# Patient Record
Sex: Male | Born: 1980 | Race: White | Hispanic: No | Marital: Single | Smoking: Never smoker
Health system: Southern US, Community
[De-identification: ages and names within clinical notes are randomized; demographics above are authoritative.]

## PROBLEM LIST (undated history)

## (undated) ENCOUNTER — Inpatient Hospital Stay: Payer: Self-pay | Admitting: Physician Assistant

## (undated) DIAGNOSIS — R079 Chest pain, unspecified: Secondary | ICD-10-CM

## (undated) DIAGNOSIS — K5790 Diverticulosis of intestine, part unspecified, without perforation or abscess without bleeding: Secondary | ICD-10-CM

## (undated) DIAGNOSIS — R4586 Emotional lability: Secondary | ICD-10-CM

## (undated) DIAGNOSIS — R002 Palpitations: Secondary | ICD-10-CM

## (undated) DIAGNOSIS — F32A Depression, unspecified: Secondary | ICD-10-CM

## (undated) DIAGNOSIS — N200 Calculus of kidney: Secondary | ICD-10-CM

## (undated) DIAGNOSIS — G43909 Migraine, unspecified, not intractable, without status migrainosus: Secondary | ICD-10-CM

## (undated) DIAGNOSIS — R569 Unspecified convulsions: Secondary | ICD-10-CM

## (undated) DIAGNOSIS — F329 Major depressive disorder, single episode, unspecified: Secondary | ICD-10-CM

## (undated) DIAGNOSIS — R9430 Abnormal result of cardiovascular function study, unspecified: Secondary | ICD-10-CM

## (undated) DIAGNOSIS — IMO0002 Reserved for concepts with insufficient information to code with codable children: Secondary | ICD-10-CM

## (undated) DIAGNOSIS — I10 Essential (primary) hypertension: Secondary | ICD-10-CM

## (undated) DIAGNOSIS — G4730 Sleep apnea, unspecified: Secondary | ICD-10-CM

## (undated) DIAGNOSIS — G9381 Temporal sclerosis: Secondary | ICD-10-CM

## (undated) DIAGNOSIS — K219 Gastro-esophageal reflux disease without esophagitis: Secondary | ICD-10-CM

## (undated) HISTORY — DX: Palpitations: R00.2

## (undated) HISTORY — PX: HERNIA REPAIR: SHX51

## (undated) HISTORY — PX: NECK SURGERY: SHX720

## (undated) HISTORY — DX: Abnormal result of cardiovascular function study, unspecified: R94.30

## (undated) HISTORY — DX: Reserved for concepts with insufficient information to code with codable children: IMO0002

## (undated) HISTORY — DX: Temporal sclerosis: G93.81

## (undated) HISTORY — PX: WRIST FUSION: SHX839

## (undated) HISTORY — PX: CARDIAC CATHETERIZATION: SHX172

## (undated) HISTORY — PX: TONSILLECTOMY: SUR1361

## (undated) HISTORY — DX: Gastro-esophageal reflux disease without esophagitis: K21.9

## (undated) HISTORY — PX: CHOLECYSTECTOMY: SHX55

---

## 2002-03-24 ENCOUNTER — Emergency Department (HOSPITAL_COMMUNITY): Admission: EM | Admit: 2002-03-24 | Discharge: 2002-03-25 | Payer: Self-pay | Admitting: Emergency Medicine

## 2002-03-24 ENCOUNTER — Encounter: Payer: Self-pay | Admitting: Emergency Medicine

## 2002-12-24 ENCOUNTER — Inpatient Hospital Stay (HOSPITAL_COMMUNITY): Admission: EM | Admit: 2002-12-24 | Discharge: 2002-12-25 | Payer: Self-pay | Admitting: Emergency Medicine

## 2002-12-24 ENCOUNTER — Encounter: Payer: Self-pay | Admitting: Emergency Medicine

## 2002-12-25 ENCOUNTER — Encounter: Payer: Self-pay | Admitting: *Deleted

## 2003-08-24 ENCOUNTER — Emergency Department (HOSPITAL_COMMUNITY): Admission: EM | Admit: 2003-08-24 | Discharge: 2003-08-25 | Payer: Self-pay | Admitting: Emergency Medicine

## 2004-05-26 ENCOUNTER — Emergency Department (HOSPITAL_COMMUNITY): Admission: EM | Admit: 2004-05-26 | Discharge: 2004-05-26 | Payer: Self-pay | Admitting: Family Medicine

## 2004-06-29 ENCOUNTER — Emergency Department (HOSPITAL_COMMUNITY): Admission: EM | Admit: 2004-06-29 | Discharge: 2004-06-30 | Payer: Self-pay | Admitting: Emergency Medicine

## 2006-04-01 ENCOUNTER — Emergency Department (HOSPITAL_COMMUNITY): Admission: EM | Admit: 2006-04-01 | Discharge: 2006-04-01 | Payer: Self-pay | Admitting: Emergency Medicine

## 2011-04-03 ENCOUNTER — Inpatient Hospital Stay (INDEPENDENT_AMBULATORY_CARE_PROVIDER_SITE_OTHER)
Admission: RE | Admit: 2011-04-03 | Discharge: 2011-04-03 | Disposition: A | Discharge status: Home / Self Care | Payer: Medicaid Other | Source: Ambulatory Visit | Attending: Emergency Medicine | Admitting: Emergency Medicine

## 2011-04-03 DIAGNOSIS — I10 Essential (primary) hypertension: Secondary | ICD-10-CM

## 2011-04-03 DIAGNOSIS — R569 Unspecified convulsions: Secondary | ICD-10-CM

## 2011-05-14 ENCOUNTER — Emergency Department (HOSPITAL_COMMUNITY)
Admission: EM | Admit: 2011-05-14 | Discharge: 2011-05-14 | Disposition: A | Discharge status: Home / Self Care | Payer: Medicaid Other | Attending: Emergency Medicine | Admitting: Emergency Medicine

## 2011-05-14 ENCOUNTER — Encounter: Payer: Self-pay | Admitting: Emergency Medicine

## 2011-05-14 ENCOUNTER — Emergency Department (HOSPITAL_COMMUNITY): Payer: Medicaid Other

## 2011-05-14 DIAGNOSIS — Z87891 Personal history of nicotine dependence: Secondary | ICD-10-CM | POA: Insufficient documentation

## 2011-05-14 DIAGNOSIS — F3289 Other specified depressive episodes: Secondary | ICD-10-CM | POA: Insufficient documentation

## 2011-05-14 DIAGNOSIS — R569 Unspecified convulsions: Secondary | ICD-10-CM | POA: Insufficient documentation

## 2011-05-14 DIAGNOSIS — I10 Essential (primary) hypertension: Secondary | ICD-10-CM | POA: Insufficient documentation

## 2011-05-14 DIAGNOSIS — R109 Unspecified abdominal pain: Secondary | ICD-10-CM | POA: Insufficient documentation

## 2011-05-14 DIAGNOSIS — K219 Gastro-esophageal reflux disease without esophagitis: Secondary | ICD-10-CM | POA: Insufficient documentation

## 2011-05-14 DIAGNOSIS — F329 Major depressive disorder, single episode, unspecified: Secondary | ICD-10-CM | POA: Insufficient documentation

## 2011-05-14 DIAGNOSIS — J45909 Unspecified asthma, uncomplicated: Secondary | ICD-10-CM | POA: Insufficient documentation

## 2011-05-14 DIAGNOSIS — K5730 Diverticulosis of large intestine without perforation or abscess without bleeding: Secondary | ICD-10-CM | POA: Insufficient documentation

## 2011-05-14 DIAGNOSIS — G43909 Migraine, unspecified, not intractable, without status migrainosus: Secondary | ICD-10-CM | POA: Insufficient documentation

## 2011-05-14 HISTORY — DX: Essential (primary) hypertension: I10

## 2011-05-14 HISTORY — DX: Unspecified convulsions: R56.9

## 2011-05-14 HISTORY — DX: Depression, unspecified: F32.A

## 2011-05-14 HISTORY — DX: Major depressive disorder, single episode, unspecified: F32.9

## 2011-05-14 HISTORY — DX: Migraine, unspecified, not intractable, without status migrainosus: G43.909

## 2011-05-14 HISTORY — DX: Diverticulosis of intestine, part unspecified, without perforation or abscess without bleeding: K57.90

## 2011-05-14 HISTORY — DX: Emotional lability: R45.86

## 2011-05-14 LAB — URINALYSIS, ROUTINE W REFLEX MICROSCOPIC
Bilirubin Urine: NEGATIVE
Glucose, UA: NEGATIVE mg/dL
Hgb urine dipstick: NEGATIVE
Nitrite: NEGATIVE
Protein, ur: NEGATIVE mg/dL
Specific Gravity, Urine: 1.02 (ref 1.005–1.030)
pH: 6 (ref 5.0–8.0)

## 2011-05-14 LAB — CBC
HCT: 39.6 % (ref 39.0–52.0)
Hemoglobin: 13.9 g/dL (ref 13.0–17.0)
MCH: 29.8 pg (ref 26.0–34.0)
MCHC: 35.1 g/dL (ref 30.0–36.0)
MCV: 85 fL (ref 78.0–100.0)
Platelets: 205 10*3/uL (ref 150–400)
RBC: 4.66 MIL/uL (ref 4.22–5.81)
RDW: 12.9 % (ref 11.5–15.5)

## 2011-05-14 LAB — BASIC METABOLIC PANEL
BUN: 13 mg/dL (ref 6–23)
CO2: 27 mEq/L (ref 19–32)
Calcium: 10.3 mg/dL (ref 8.4–10.5)
Chloride: 100 mEq/L (ref 96–112)
GFR calc Af Amer: >90 mL/min (ref >90)
Glucose, Bld: 81 mg/dL (ref 70–99)
Sodium: 137 mEq/L (ref 135–145)

## 2011-05-14 MED ORDER — HYDROMORPHONE HCL PF 1 MG/ML IJ SOLN
0.5000 mg | Freq: Once | INTRAMUSCULAR | Status: AC
  Administered 2011-05-14: 0.5 mg via INTRAVENOUS
  Filled 2011-05-14: qty 1

## 2011-05-14 MED ORDER — HYDROMORPHONE HCL PF 1 MG/ML IJ SOLN
1.0000 mg | Freq: Once | INTRAMUSCULAR | Status: AC
  Administered 2011-05-14: 1 mg via INTRAVENOUS
  Filled 2011-05-14: qty 1

## 2011-05-14 MED ORDER — HYDROCODONE-ACETAMINOPHEN 5-500 MG PO TABS: 1.0000 | ORAL_TABLET | Freq: Four times a day (QID) | ORAL | Status: AC | PRN

## 2011-05-14 MED ORDER — ONDANSETRON HCL 4 MG/2ML IJ SOLN
4.0000 mg | Freq: Once | INTRAMUSCULAR | Status: AC
  Administered 2011-05-14: 4 mg via INTRAVENOUS
  Filled 2011-05-14: qty 2

## 2011-05-14 MED ORDER — SODIUM CHLORIDE 0.9 % IV SOLN
Freq: Once | INTRAVENOUS | Status: AC
  Administered 2011-05-14: 03:00:00 via INTRAVENOUS

## 2011-05-14 MED ORDER — IOHEXOL 300 MG/ML  SOLN
100.0000 mL | Freq: Once | INTRAMUSCULAR | Status: AC | PRN
  Administered 2011-05-14: 100 mL via INTRAVENOUS

## 2011-05-14 NOTE — ED Notes (Signed)
Pt requesting pain medication, orders received.

## 2011-05-14 NOTE — ED Notes (Addendum)
Patient complaining of lower abdominal pain x 2-3 days. Also complaining of nausea, denies vomiting. Pt has history of diverticulitis.

## 2011-05-14 NOTE — ED Notes (Signed)
Pt talking on cell phone upon RN entering the room.

## 2011-05-14 NOTE — ED Provider Notes (Signed)
History     CSN: [id number] Arrival date & time: 05/14/2011  1:56 AM   First MD Initiated Contact with Patient 05/14/11 0206      Chief Complaint  Patient presents with  . Abdominal Pain    (Consider location/radiation/quality/duration/timing/severity/associated sxs/prior treatment) HPI Comments: 30 year old male with a history of cholecystectomy, bilateral inguinal hernia repairs as well as umbilical hernia repair who presents with approximately 3 days of abdominal pain. The location is the lower abdomen, the feeling is a tearing or pulling sensation. It is made worse with palpation and this evening while he was lifting heavy pallets of soda pop the pain became severe. He admits that he has no diarrhea or change in his urinary habits and no blood in his stools. He is actively passing gas and has had no change in bowel habits. He denies nausea or vomiting prior to his arrival.  Symptoms are constant and currently rated 8/10 in severity  Patient is a 30 y.o. male presenting with abdominal pain. The history is provided by the patient.  Abdominal Pain The primary symptoms of the illness include abdominal pain.    Past Medical History  Diagnosis Date  . Diverticulosis   . Hypertension   . Seizures   . Asthma   . Acid reflux   . Migraines   . Depression   . Mood swings   . CPAP (continuous positive airway pressure) dependence     Past Surgical History  Procedure Date  . Hernia repair   . Cholecystectomy   . Tonsillectomy   . Neck surgery   . Wrist fusion left    History reviewed. No pertinent family history.  History  Substance Use Topics  . Smoking status: Former Games developer  . Smokeless tobacco: Not on file  . Alcohol Use: No      Review of Systems  Gastrointestinal: Positive for abdominal pain.  All other systems reviewed and are negative.    Allergies  Bee venom; Mobic; Penicillins; Sulfa antibiotics; Morphine and related; and Toradol  Home Medications    Current Outpatient Rx  Name Route Sig Dispense Refill  . ALBUTEROL SULFATE HFA 108 (90 BASE) MCG/ACT IN AERS Inhalation Inhale 2 puffs into the lungs every 6 (six) hours as needed.      Marland Kitchen EPINEPHRINE 0.15 MG/0.3ML IJ DEVI Intramuscular Inject 0.15 mg into the muscle as needed.      Marland Kitchen LAMOTRIGINE 100 MG PO TABS Oral Take 200 mg by mouth daily.      Marland Kitchen LISINOPRIL-HYDROCHLOROTHIAZIDE 20-12.5 MG PO TABS Oral Take 1 tablet by mouth daily.      Marland Kitchen LORATADINE 10 MG PO TABS Oral Take 10 mg by mouth daily.      . MULTI-VITAMIN/MINERALS PO TABS Oral Take 1 tablet by mouth daily.      Marland Kitchen RANITIDINE HCL 150 MG PO TABS Oral Take 150 mg by mouth 2 (two) times daily.      . SERTRALINE HCL 100 MG PO TABS Oral Take 200 mg by mouth daily.      . SUCRALFATE 1 G PO TABS Oral Take 1 g by mouth 2 (two) times daily.      . SUMATRIPTAN SUCCINATE 100 MG PO TABS Oral Take 100 mg by mouth every 2 (two) hours as needed.      Marland Kitchen HYDROCODONE-ACETAMINOPHEN 5-500 MG PO TABS Oral Take 1 tablet by mouth every 6 (six) hours as needed for pain. 10 tablet 0    BP 133/68  Pulse 68  Temp(Src)  97.6 F (36.4 C) (Oral)  Resp 18  Ht 5\' 9"  (1.753 m)  Wt 235 lb (106.595 kg)  BMI 34.70 kg/m2  SpO2 97%  Physical Exam  Nursing note and vitals reviewed. Constitutional: He appears well-developed and well-nourished. No distress.  HENT:  Head: Normocephalic and atraumatic.  Mouth/Throat: Oropharynx is clear and moist. No oropharyngeal exudate.  Eyes: Conjunctivae and EOM are normal. Pupils are equal, round, and reactive to light. Right eye exhibits no discharge. Left eye exhibits no discharge. No scleral icterus.  Neck: Normal range of motion. Neck supple. No JVD present. No thyromegaly present.  Cardiovascular: Normal rate, regular rhythm, normal heart sounds and intact distal pulses.  Exam reveals no gallop and no friction rub.   No murmur heard. Pulmonary/Chest: Effort normal and breath sounds normal. No respiratory distress. He  has no wheezes. He has no rales.  Abdominal: Soft. Bowel sounds are normal. He exhibits no distension and no mass. There is tenderness ( Bilateral lower abdominal tenderness, suprapubic tenderness, bilateral inguinal region tenderness without masses).       No upper abdominal tenderness, non-peritoneal, no guarding, no masses.  Positive Carnett's sign  Genitourinary:       Penis normal, scrotum normal, testicles normal bilaterally, no inguinal hernias felt  Musculoskeletal: Normal range of motion. He exhibits no edema and no tenderness.  Lymphadenopathy:    He has no cervical adenopathy.  Neurological: He is alert. Coordination normal.  Skin: Skin is warm and dry. No rash noted. No erythema.  Psychiatric: He has a normal mood and affect. His behavior is normal.    ED Course  Procedures (including critical care time)   Labs Reviewed  CBC  BASIC METABOLIC PANEL  URINALYSIS, ROUTINE W REFLEX MICROSCOPIC   Ct Abdomen Pelvis W Contrast  05/14/2011  *RADIOLOGY REPORT*  Clinical Data: Lower abdominal pain.  History of hernia repairs. Felt something tear in his lower abdomen when he picked up a case of sodas.  CT ABDOMEN AND PELVIS WITH CONTRAST  Technique:  Multidetector CT imaging of the abdomen and pelvis was performed following the standard protocol during bolus administration of intravenous contrast.  Contrast: OMNIPAQUE IOHEXOL 300 MG/ML IV SOLN  Comparison: 04/06/2011  Findings: Mild dependent atelectasis in the lung bases.  Low attenuation change throughout the liver consistent with fatty infiltration.  Normal spleen size.  Surgical absence of the gallbladder.  The pancreas, adrenal glands, kidneys, stomach, small bowel, abdominal aorta, and retroperitoneal lymph nodes are unremarkable.  No free air or free fluid in the abdomen.  The colon is decompressed with scattered stool present.  Scarring and stranding scarring and stranding in the umbilical region may represent postoperative  changes or small umbilical hernia.  No bowel herniation although a focal bowel loop is adjacent to this area and may be tethered.  Pelvis:  The bladder is not well distended but the bladder wall appears thickened.  This might be due to under distension or infection.  Scarring in the inguinal regions consistent with hernia repairs.  No free or loculated pelvic fluid collections.  Scattered diverticula in the sigmoid colon without inflammatory change.  The appendix is normal.  No significant pelvic lymphadenopathy.  Normal alignment of the lumbar vertebrae.  IMPRESSION: No acute process demonstrated in the abdomen or pelvis.  Scarring around the emboli this which might represent postoperative change or residual small hernia.  No bowel herniation although a focal bowel loop is adjacent to this area and may be tethered. The appearance is  similar to the previous study.  Bladder wall thickening which might represent cystitis or under distension.  Original Report Authenticated By: Marlon Pel, M.D.     1. Abdominal wall pain       MDM  Patient does have pain on palpation of the inguinal regions specifically left greater than right, will check for incarcerated hernia, diverticulitis, possibly abdominal wall injury from heavy lifting. He currently has nausea after abdominal exam, vital signs appear normal, IV medications, laboratory workup, CT scan   Patient improved with medication, CT scan reviewed and shows no acute findings. I suspect that there is a small tear of the abdominal rectus muscle, vital signs normal, lab work unremarkable.  Prescription  #1 hydrocodone     Vida Roller, MD 05/14/11 [phone]

## 2011-05-14 NOTE — ED Notes (Signed)
MD at bedside. 

## 2011-05-14 NOTE — ED Notes (Signed)
Pt and family requesting to speak with MD about results and pain medication. EDP made aware, will address pt and family.

## 2011-05-14 NOTE — ED Notes (Signed)
Pt returned from CT °

## 2011-05-14 NOTE — ED Notes (Signed)
Patient transported to CT 

## 2011-05-14 NOTE — ED Notes (Signed)
Family at bedside. 

## 2011-05-14 NOTE — ED Notes (Signed)
Upon entering room patient playing game on his cell phone. Pt pain assessed and pt states pain 10/10 at this time.

## 2011-05-14 NOTE — ED Notes (Signed)
Pt c/o lower abdominal pain x 3 days. Pt describes pain as a "sharp, throbbing" pain. Pt reports that he has been nauseous, however denies vomiting. Pt denies any urinary complaints and reports normal bowel movements. Positive bowel sounds in all four quadrants. Pt reports pain 10/10 at this time.

## 2011-06-15 ENCOUNTER — Emergency Department (HOSPITAL_COMMUNITY): Payer: Medicaid Other

## 2011-06-15 ENCOUNTER — Emergency Department (HOSPITAL_COMMUNITY)
Admission: EM | Admit: 2011-06-15 | Discharge: 2011-06-15 | Disposition: A | Discharge status: Home / Self Care | Payer: Medicaid Other | Attending: Emergency Medicine | Admitting: Emergency Medicine

## 2011-06-15 ENCOUNTER — Encounter (HOSPITAL_COMMUNITY): Payer: Self-pay | Admitting: *Deleted

## 2011-06-15 DIAGNOSIS — K5730 Diverticulosis of large intestine without perforation or abscess without bleeding: Secondary | ICD-10-CM | POA: Insufficient documentation

## 2011-06-15 DIAGNOSIS — R569 Unspecified convulsions: Secondary | ICD-10-CM | POA: Insufficient documentation

## 2011-06-15 DIAGNOSIS — G43909 Migraine, unspecified, not intractable, without status migrainosus: Secondary | ICD-10-CM | POA: Insufficient documentation

## 2011-06-15 DIAGNOSIS — R059 Cough, unspecified: Secondary | ICD-10-CM | POA: Insufficient documentation

## 2011-06-15 DIAGNOSIS — I10 Essential (primary) hypertension: Secondary | ICD-10-CM | POA: Insufficient documentation

## 2011-06-15 DIAGNOSIS — J45909 Unspecified asthma, uncomplicated: Secondary | ICD-10-CM | POA: Insufficient documentation

## 2011-06-15 DIAGNOSIS — K219 Gastro-esophageal reflux disease without esophagitis: Secondary | ICD-10-CM | POA: Insufficient documentation

## 2011-06-15 DIAGNOSIS — R042 Hemoptysis: Secondary | ICD-10-CM | POA: Insufficient documentation

## 2011-06-15 DIAGNOSIS — R05 Cough: Secondary | ICD-10-CM | POA: Insufficient documentation

## 2011-06-15 DIAGNOSIS — K2970 Gastritis, unspecified, without bleeding: Secondary | ICD-10-CM | POA: Insufficient documentation

## 2011-06-15 DIAGNOSIS — F39 Unspecified mood [affective] disorder: Secondary | ICD-10-CM | POA: Insufficient documentation

## 2011-06-15 DIAGNOSIS — R1110 Vomiting, unspecified: Secondary | ICD-10-CM | POA: Insufficient documentation

## 2011-06-15 DIAGNOSIS — K2990 Gastroduodenitis, unspecified, without bleeding: Secondary | ICD-10-CM | POA: Insufficient documentation

## 2011-06-15 DIAGNOSIS — R109 Unspecified abdominal pain: Secondary | ICD-10-CM | POA: Insufficient documentation

## 2011-06-15 LAB — CBC
HCT: 44.3 % (ref 39.0–52.0)
MCH: 29.1 pg (ref 26.0–34.0)
MCHC: 34.5 g/dL (ref 30.0–36.0)
MCV: 84.2 fL (ref 78.0–100.0)
Platelets: 251 10*3/uL (ref 150–400)
RBC: 5.26 MIL/uL (ref 4.22–5.81)
RDW: 12.8 % (ref 11.5–15.5)
WBC: 8 10*3/uL (ref 4.0–10.5)

## 2011-06-15 LAB — COMPREHENSIVE METABOLIC PANEL
ALT: 70 U/L — ABNORMAL HIGH (ref 0–53)
AST: 44 U/L — ABNORMAL HIGH (ref 0–37)
Albumin: 4.6 g/dL (ref 3.5–5.2)
BUN: 11 mg/dL (ref 6–23)
CO2: 26 mEq/L (ref 19–32)
Calcium: 10.7 mg/dL — ABNORMAL HIGH (ref 8.4–10.5)
Creatinine, Ser: 1.01 mg/dL (ref 0.50–1.35)
GFR calc Af Amer: >90 mL/min (ref >90)
GFR calc non Af Amer: >90 mL/min (ref >90)
Glucose, Bld: 84 mg/dL (ref 70–99)
Potassium: 4 mEq/L (ref 3.5–5.1)

## 2011-06-15 LAB — DIFFERENTIAL
Basophils Absolute: 0 10*3/uL (ref 0.0–0.1)
Basophils Relative: 1 % (ref 0–1)
Eosinophils Absolute: 0.3 10*3/uL (ref 0.0–0.7)
Eosinophils Relative: 4 % (ref 0–5)
Lymphocytes Relative: 33 % (ref 12–46)
Lymphs Abs: 2.6 10*3/uL (ref 0.7–4.0)
Monocytes Absolute: 1 10*3/uL (ref 0.1–1.0)
Monocytes Relative: 12 % (ref 3–12)

## 2011-06-15 LAB — LIPASE, BLOOD: Lipase: 32 U/L (ref 11–59)

## 2011-06-15 MED ORDER — PANTOPRAZOLE SODIUM 40 MG IV SOLR
40.0000 mg | Freq: Once | INTRAVENOUS | Status: AC
Start: 2011-06-15 — End: 2011-06-15
  Administered 2011-06-15: 40 mg via INTRAVENOUS
  Filled 2011-06-15: qty 40

## 2011-06-15 MED ORDER — HYDROMORPHONE HCL PF 1 MG/ML IJ SOLN
INTRAMUSCULAR | Status: AC
  Administered 2011-06-15: 1 mg
  Filled 2011-06-15: qty 1

## 2011-06-15 MED ORDER — HYDROMORPHONE HCL PF 1 MG/ML IJ SOLN
1.0000 mg | Freq: Once | INTRAMUSCULAR | Status: AC
  Administered 2011-06-15: 1 mg via INTRAVENOUS
  Filled 2011-06-15: qty 1

## 2011-06-15 MED ORDER — OXYCODONE-ACETAMINOPHEN 5-325 MG PO TABS: 1.0000 | ORAL_TABLET | Freq: Four times a day (QID) | ORAL | Status: AC | PRN

## 2011-06-15 MED ORDER — ONDANSETRON HCL 4 MG/2ML IJ SOLN
4.0000 mg | Freq: Once | INTRAMUSCULAR | Status: AC
  Administered 2011-06-15: 4 mg via INTRAVENOUS
  Filled 2011-06-15: qty 2

## 2011-06-15 MED ORDER — SODIUM CHLORIDE 0.9 % IV SOLN
Freq: Once | INTRAVENOUS | Status: AC
  Administered 2011-06-15: 20:00:00 via INTRAVENOUS

## 2011-06-15 MED ORDER — ONDANSETRON HCL 4 MG/2ML IJ SOLN
INTRAMUSCULAR | Status: AC
  Administered 2011-06-15: 4 mg
  Filled 2011-06-15: qty 2

## 2011-06-15 MED ORDER — PANTOPRAZOLE SODIUM 20 MG PO TBEC: 20.0000 mg | DELAYED_RELEASE_TABLET | Freq: Every day | ORAL | Status: DC

## 2011-06-15 NOTE — ED Notes (Signed)
Abd pain for 2 day, vomiting blood.and coughing blood.  Normal bms.

## 2011-06-15 NOTE — ED Notes (Signed)
C/o coughing up blood and vomiting onset this morning.  C/o epigastric x 2 days.

## 2011-06-15 NOTE — ED Notes (Signed)
Dr Zammit in to see pt 

## 2011-06-15 NOTE — ED Notes (Signed)
Patient is resting comfortably. 

## 2011-06-15 NOTE — ED Provider Notes (Signed)
History     CSN: [id number]  Arrival date & time 06/15/11  4098   First MD Initiated Contact with Patient 06/15/11 1933      Chief Complaint  Patient presents with  . Hemoptysis  . Emesis    (Consider location/radiation/quality/duration/timing/severity/associated sxs/prior treatment) Patient is a 31 y.o. male presenting with vomiting. The history is provided by the patient (patient states that he has been coughing and having some abdominal pain. Patient states that he thinks he threw up some blood and may have coughed up some blood.). No language interpreter was used.  Emesis  This is a new problem. The current episode started 2 days ago. The problem occurs 2 to 4 times per day. The problem has not changed since onset.The emesis has an appearance of stomach contents. There has been no fever. Associated symptoms include abdominal pain. Pertinent negatives include no chills, no cough, no diarrhea, no fever and no headaches. Risk factors: non.    Past Medical History  Diagnosis Date  . Diverticulosis   . Hypertension   . Seizures   . Asthma   . Acid reflux   . Migraines   . Depression   . Mood swings   . CPAP (continuous positive airway pressure) dependence     Past Surgical History  Procedure Date  . Hernia repair   . Cholecystectomy   . Tonsillectomy   . Neck surgery   . Wrist fusion left    No family history on file.  History  Substance Use Topics  . Smoking status: Former Games developer  . Smokeless tobacco: Not on file  . Alcohol Use: No      Review of Systems  Constitutional: Negative for fever, chills and fatigue.  HENT: Negative for congestion, sinus pressure and ear discharge.   Eyes: Negative for discharge.  Respiratory: Negative for cough.   Cardiovascular: Negative for chest pain.  Gastrointestinal: Positive for vomiting and abdominal pain. Negative for diarrhea.  Genitourinary: Negative for frequency and hematuria.  Musculoskeletal: Negative for back  pain.  Skin: Negative for rash.  Neurological: Negative for seizures and headaches.  Hematological: Negative.   Psychiatric/Behavioral: Negative for hallucinations.    Allergies  Bee venom; Mobic; Penicillins; Sulfa antibiotics; and Toradol  Home Medications   Current Outpatient Rx  Name Route Sig Dispense Refill  . ALBUTEROL SULFATE HFA 108 (90 BASE) MCG/ACT IN AERS Inhalation Inhale 2 puffs into the lungs every 6 (six) hours as needed. For shortness of breath    . LAMOTRIGINE 100 MG PO TABS Oral Take 200 mg by mouth daily.      Marland Kitchen LISINOPRIL-HYDROCHLOROTHIAZIDE 20-12.5 MG PO TABS Oral Take 1 tablet by mouth daily.      Marland Kitchen LORATADINE 10 MG PO TABS Oral Take 10 mg by mouth daily.      . MULTI-VITAMIN/MINERALS PO TABS Oral Take 1 tablet by mouth daily.      Marland Kitchen RANITIDINE HCL 150 MG PO TABS Oral Take 150 mg by mouth 2 (two) times daily.      . SERTRALINE HCL 100 MG PO TABS Oral Take 200 mg by mouth daily.      . SUCRALFATE 1 G PO TABS Oral Take 1 g by mouth 2 (two) times daily.      . SUMATRIPTAN SUCCINATE 100 MG PO TABS Oral Take 100 mg by mouth every 2 (two) hours as needed. For migraine    . EPINEPHRINE 0.15 MG/0.3ML IJ DEVI Intramuscular Inject 0.15 mg into the muscle as needed.      Marland Kitchen  OXYCODONE-ACETAMINOPHEN 5-325 MG PO TABS Oral Take 1 tablet by mouth every 6 (six) hours as needed for pain. 20 tablet 0  . PANTOPRAZOLE SODIUM 20 MG PO TBEC Oral Take 1 tablet (20 mg total) by mouth daily. 30 tablet 0    BP 119/75  Pulse 87  Temp(Src) 98.4 F (36.9 C) (Oral)  Resp 20  Ht 5\' 9"  (1.753 m)  Wt 235 lb (106.595 kg)  BMI 34.70 kg/m2  SpO2 99%  Physical Exam  Constitutional: He is oriented to person, place, and time. He appears well-developed.  HENT:  Head: Normocephalic and atraumatic.  Eyes: Conjunctivae and EOM are normal. No scleral icterus.  Neck: Neck supple. No thyromegaly present.  Cardiovascular: Normal rate and regular rhythm.  Exam reveals no gallop and no friction rub.    No murmur heard. Pulmonary/Chest: No stridor. He has no wheezes. He has no rales. He exhibits no tenderness.  Abdominal: He exhibits no distension. There is tenderness. There is no rebound.       Mild tendernous epigastric  Musculoskeletal: Normal range of motion. He exhibits no edema.  Lymphadenopathy:    He has no cervical adenopathy.  Neurological: He is oriented to person, place, and time. Coordination normal.  Skin: No rash noted. No erythema.  Psychiatric: He has a normal mood and affect. His behavior is normal.    ED Course  Procedures (including critical care time)  Labs Reviewed  COMPREHENSIVE METABOLIC PANEL - Abnormal; Notable for the following:    Calcium 10.7 (*)    AST 44 (*)    ALT 70 (*)    All other components within normal limits  CBC  DIFFERENTIAL  LIPASE, BLOOD   Dg Chest 2 View  06/15/2011  *RADIOLOGY REPORT*  Clinical Data: Sore throat, cough, asthma  CHEST - 2 VIEW  Comparison:  None available  Findings:  The heart size and mediastinal contours are within normal limits.  Both lungs are clear.  The visualized skeletal structures are unremarkable.  IMPRESSION: No active cardiopulmonary disease.  Original Report Authenticated By: Judie Petit. Ruel Favors, M.D.   Dg Abd 2 Views  06/15/2011  *RADIOLOGY REPORT*  Clinical Data: Hematemesis.  Abdominal pain.  Previous cholecystectomy.  ABDOMEN - 2 VIEW  Comparison: None.  Findings: Bowel gas pattern is normal without evidence of ileus, obstruction or free air.  There are clips in the right upper quadrant related to previous cholecystectomy.  No significant bony finding.  No worrisome soft tissue calcifications.  IMPRESSION: Unremarkable radiographs.  Original Report Authenticated By: Thomasenia Sales, M.D.     1. Abdominal pain    Results for orders placed during the hospital encounter of 06/15/11  CBC      Component Value Range   WBC 8.0  4.0 - 10.5 (K/uL)   RBC 5.26  4.22 - 5.81 (MIL/uL)   Hemoglobin 15.3  13.0 - 17.0  (g/dL)   HCT 91.4  78.2 - 95.6 (%)   MCV 84.2  78.0 - 100.0 (fL)   MCH 29.1  26.0 - 34.0 (pg)   MCHC 34.5  30.0 - 36.0 (g/dL)   RDW 21.3  08.6 - 57.8 (%)   Platelets 251  150 - 400 (K/uL)  DIFFERENTIAL      Component Value Range   Neutrophils Relative 51  43 - 77 (%)   Neutro Abs 4.1  1.7 - 7.7 (K/uL)   Lymphocytes Relative 33  12 - 46 (%)   Lymphs Abs 2.6  0.7 - 4.0 (K/uL)  Monocytes Relative 12  3 - 12 (%)   Monocytes Absolute 1.0  0.1 - 1.0 (K/uL)   Eosinophils Relative 4  0 - 5 (%)   Eosinophils Absolute 0.3  0.0 - 0.7 (K/uL)   Basophils Relative 1  0 - 1 (%)   Basophils Absolute 0.0  0.0 - 0.1 (K/uL)  COMPREHENSIVE METABOLIC PANEL      Component Value Range   Sodium 135  135 - 145 (mEq/L)   Potassium 4.0  3.5 - 5.1 (mEq/L)   Chloride 99  96 - 112 (mEq/L)   CO2 26  19 - 32 (mEq/L)   Glucose, Bld 84  70 - 99 (mg/dL)   BUN 11  6 - 23 (mg/dL)   Creatinine, Ser 4.09  0.50 - 1.35 (mg/dL)   Calcium 81.1 (*) 8.4 - 10.5 (mg/dL)   Total Protein 8.1  6.0 - 8.3 (g/dL)   Albumin 4.6  3.5 - 5.2 (g/dL)   AST 44 (*) 0 - 37 (U/L)   ALT 70 (*) 0 - 53 (U/L)   Alkaline Phosphatase 96  39 - 117 (U/L)   Total Bilirubin 0.3  0.3 - 1.2 (mg/dL)   GFR calc non Af Amer >90  >90 (mL/min)   GFR calc Af Amer >90  >90 (mL/min)  LIPASE, BLOOD      Component Value Range   Lipase 32  11 - 59 (U/L)   Dg Chest 2 View  06/15/2011  *RADIOLOGY REPORT*  Clinical Data: Sore throat, cough, asthma  CHEST - 2 VIEW  Comparison:  None available  Findings:  The heart size and mediastinal contours are within normal limits.  Both lungs are clear.  The visualized skeletal structures are unremarkable.  IMPRESSION: No active cardiopulmonary disease.  Original Report Authenticated By: Judie Petit. Ruel Favors, M.D.   Dg Abd 2 Views  06/15/2011  *RADIOLOGY REPORT*  Clinical Data: Hematemesis.  Abdominal pain.  Previous cholecystectomy.  ABDOMEN - 2 VIEW  Comparison: None.  Findings: Bowel gas pattern is normal without evidence  of ileus, obstruction or free air.  There are clips in the right upper quadrant related to previous cholecystectomy.  No significant bony finding.  No worrisome soft tissue calcifications.  IMPRESSION: Unremarkable radiographs.  Original Report Authenticated By: Thomasenia Sales, M.D.       MDM  Abdominal pain from gastritis,  Possible gall stones        Benny Lennert, MD 06/15/11 2145

## 2011-06-22 ENCOUNTER — Emergency Department (HOSPITAL_COMMUNITY)
Admission: EM | Admit: 2011-06-22 | Discharge: 2011-06-22 | Disposition: A | Discharge status: Home / Self Care | Payer: Medicaid Other | Attending: Emergency Medicine | Admitting: Emergency Medicine

## 2011-06-22 ENCOUNTER — Encounter (HOSPITAL_COMMUNITY): Payer: Self-pay | Admitting: *Deleted

## 2011-06-22 ENCOUNTER — Emergency Department (HOSPITAL_COMMUNITY): Payer: Medicaid Other

## 2011-06-22 DIAGNOSIS — F3289 Other specified depressive episodes: Secondary | ICD-10-CM | POA: Insufficient documentation

## 2011-06-22 DIAGNOSIS — R10814 Left lower quadrant abdominal tenderness: Secondary | ICD-10-CM | POA: Insufficient documentation

## 2011-06-22 DIAGNOSIS — R10816 Epigastric abdominal tenderness: Secondary | ICD-10-CM | POA: Insufficient documentation

## 2011-06-22 DIAGNOSIS — R079 Chest pain, unspecified: Secondary | ICD-10-CM | POA: Insufficient documentation

## 2011-06-22 DIAGNOSIS — K5730 Diverticulosis of large intestine without perforation or abscess without bleeding: Secondary | ICD-10-CM | POA: Insufficient documentation

## 2011-06-22 DIAGNOSIS — K219 Gastro-esophageal reflux disease without esophagitis: Secondary | ICD-10-CM | POA: Insufficient documentation

## 2011-06-22 DIAGNOSIS — R059 Cough, unspecified: Secondary | ICD-10-CM

## 2011-06-22 DIAGNOSIS — Z79899 Other long term (current) drug therapy: Secondary | ICD-10-CM | POA: Insufficient documentation

## 2011-06-22 DIAGNOSIS — G43909 Migraine, unspecified, not intractable, without status migrainosus: Secondary | ICD-10-CM | POA: Insufficient documentation

## 2011-06-22 DIAGNOSIS — R05 Cough: Secondary | ICD-10-CM | POA: Insufficient documentation

## 2011-06-22 DIAGNOSIS — I10 Essential (primary) hypertension: Secondary | ICD-10-CM | POA: Insufficient documentation

## 2011-06-22 DIAGNOSIS — G40909 Epilepsy, unspecified, not intractable, without status epilepticus: Secondary | ICD-10-CM | POA: Insufficient documentation

## 2011-06-22 DIAGNOSIS — F329 Major depressive disorder, single episode, unspecified: Secondary | ICD-10-CM | POA: Insufficient documentation

## 2011-06-22 DIAGNOSIS — R1013 Epigastric pain: Secondary | ICD-10-CM | POA: Insufficient documentation

## 2011-06-22 DIAGNOSIS — Z87891 Personal history of nicotine dependence: Secondary | ICD-10-CM | POA: Insufficient documentation

## 2011-06-22 DIAGNOSIS — J45909 Unspecified asthma, uncomplicated: Secondary | ICD-10-CM | POA: Insufficient documentation

## 2011-06-22 MED ORDER — BENZONATATE 200 MG PO CAPS: 200.0000 mg | ORAL_CAPSULE | Freq: Three times a day (TID) | ORAL | Status: AC | PRN

## 2011-06-22 MED ORDER — GI COCKTAIL ~~LOC~~
30.0000 mL | Freq: Once | ORAL | Status: AC
  Administered 2011-06-22: 30 mL via ORAL
  Filled 2011-06-22: qty 30

## 2011-06-22 MED ORDER — ALBUTEROL SULFATE (5 MG/ML) 0.5% IN NEBU
5.0000 mg | INHALATION_SOLUTION | Freq: Once | RESPIRATORY_TRACT | Status: AC
  Administered 2011-06-22: 5 mg via RESPIRATORY_TRACT
  Filled 2011-06-22: qty 1

## 2011-06-22 MED ORDER — OXYCODONE-ACETAMINOPHEN 5-325 MG PO TABS
2.0000 | ORAL_TABLET | Freq: Once | ORAL | Status: AC
  Administered 2011-06-22: 2 via ORAL
  Filled 2011-06-22: qty 2

## 2011-06-22 MED ORDER — BENZONATATE 100 MG PO CAPS
200.0000 mg | ORAL_CAPSULE | Freq: Once | ORAL | Status: AC
  Administered 2011-06-22: 200 mg via ORAL
  Filled 2011-06-22: qty 2

## 2011-06-22 MED ORDER — PROMETHAZINE HCL 25 MG PO TABS: 25.0000 mg | ORAL_TABLET | Freq: Four times a day (QID) | ORAL | Status: DC | PRN

## 2011-06-22 MED ORDER — DIPHENHYDRAMINE HCL 25 MG PO CAPS
25.0000 mg | ORAL_CAPSULE | Freq: Once | ORAL | Status: AC
  Administered 2011-06-22: 25 mg via ORAL
  Filled 2011-06-22: qty 1

## 2011-06-22 NOTE — ED Notes (Signed)
Pt reports abd pain and "vomiting blood" after coughing x 1 week

## 2011-06-22 NOTE — ED Provider Notes (Signed)
History     CSN: [id number]  Arrival date & time 06/22/11  Chris Spencer   First MD Initiated Contact with Patient 06/22/11 [phone]      Chief Complaint  Patient presents with  . Abdominal Pain    (Consider location/radiation/quality/duration/timing/severity/associated sxs/prior treatment) HPI Comments: 31 year old male with a history of significant acid reflux disease, seizure disorder, headache disorder, asthma who presents with a complaint of cough with hemoptysis. Patient endorses coughing for 2 days, intermittently, frequently, associated with some bright red blood in the sputum. Nothing makes this better or worse. It is associated with posttussive emesis of which he states this happened 8 or 9 times. Associated with his coughing is abdominal pain. This is epigastric and left upper quadrant in location, this pain is worsened with coughing. He denies dysuria, diarrhea, constipation. He denies lower abdominal tenderness.  Patient is a 31 y.o. male presenting with abdominal pain. The history is provided by the patient, medical records and a relative.  Abdominal Pain The primary symptoms of the illness include abdominal pain.    Past Medical History  Diagnosis Date  . Diverticulosis   . Hypertension   . Seizures   . Asthma   . Acid reflux   . Migraines   . Depression   . Mood swings   . CPAP (continuous positive airway pressure) dependence     Past Surgical History  Procedure Date  . Hernia repair   . Cholecystectomy   . Tonsillectomy   . Neck surgery   . Wrist fusion left    No family history on file.  History  Substance Use Topics  . Smoking status: Former Games developer  . Smokeless tobacco: Not on file  . Alcohol Use: No      Review of Systems  Gastrointestinal: Positive for abdominal pain.  All other systems reviewed and are negative.    Allergies  Bee venom; Mobic; Penicillins; Sulfa antibiotics; and Toradol  Home Medications   Current Outpatient Rx  Name Route  Sig Dispense Refill  . ALBUTEROL SULFATE HFA 108 (90 BASE) MCG/ACT IN AERS Inhalation Inhale 2 puffs into the lungs every 6 (six) hours as needed. For shortness of breath    . BENZONATATE 200 MG PO CAPS Oral Take 1 capsule (200 mg total) by mouth 3 (three) times daily as needed for cough. 20 capsule 0  . EPINEPHRINE 0.15 MG/0.3ML IJ DEVI Intramuscular Inject 0.15 mg into the muscle as needed.      Marland Kitchen LAMOTRIGINE 100 MG PO TABS Oral Take 200 mg by mouth daily.      Marland Kitchen LISINOPRIL-HYDROCHLOROTHIAZIDE 20-12.5 MG PO TABS Oral Take 1 tablet by mouth daily.      Marland Kitchen LORATADINE 10 MG PO TABS Oral Take 10 mg by mouth daily.      . MULTI-VITAMIN/MINERALS PO TABS Oral Take 1 tablet by mouth daily.      . OXYCODONE-ACETAMINOPHEN 5-325 MG PO TABS Oral Take 1 tablet by mouth every 6 (six) hours as needed for pain. 20 tablet 0  . PANTOPRAZOLE SODIUM 20 MG PO TBEC Oral Take 1 tablet (20 mg total) by mouth daily. 30 tablet 0  . PROMETHAZINE HCL 25 MG PO TABS Oral Take 1 tablet (25 mg total) by mouth every 6 (six) hours as needed for nausea. 12 tablet 0  . RANITIDINE HCL 150 MG PO TABS Oral Take 150 mg by mouth 2 (two) times daily.      . SERTRALINE HCL 100 MG PO TABS Oral Take 200 mg by  mouth daily.      . SUCRALFATE 1 G PO TABS Oral Take 1 g by mouth 2 (two) times daily.      . SUMATRIPTAN SUCCINATE 100 MG PO TABS Oral Take 100 mg by mouth every 2 (two) hours as needed. For migraine      BP 130/78  Pulse 66  Temp(Src) 98 F (36.7 C) (Oral)  Resp 20  Ht 5\' 9"  (1.753 m)  Wt 235 lb (106.595 kg)  BMI 34.70 kg/m2  SpO2 99%  Physical Exam  Nursing note and vitals reviewed. Constitutional: He appears well-developed and well-nourished. No distress.  HENT:  Head: Normocephalic and atraumatic.  Nose: Nose normal.  Mouth/Throat: Oropharynx is clear and moist. No oropharyngeal exudate.  Eyes: Conjunctivae are normal. Right eye exhibits no discharge. Left eye exhibits no discharge. No scleral icterus.  Neck:  Normal range of motion. Neck supple.  Cardiovascular: Normal rate, regular rhythm, normal heart sounds and intact distal pulses.  Exam reveals no gallop and no friction rub.   No murmur heard. Pulmonary/Chest: Effort normal. No respiratory distress. He has wheezes (mild end expiratory wheezing). He has no rales.  Abdominal: Soft. Bowel sounds are normal. He exhibits no distension. There is tenderness ( Left upper quadrant mild tenderness, epigastric mild tenderness. No lower abdominal tenderness, no mid abdominal tenderness. Non-peritoneal). There is no rebound.  Musculoskeletal: Normal range of motion. He exhibits no edema and no tenderness.  Lymphadenopathy:    He has no cervical adenopathy.  Neurological: He is alert. Coordination normal.  Skin: Skin is warm and dry. No rash noted. He is not diaphoretic. No erythema.    ED Course  Procedures (including critical care time)  Labs Reviewed - No data to display Dg Chest 2 View  06/22/2011  *RADIOLOGY REPORT*  Clinical Data: Chest and upper abdominal pain; hemoptysis.  CHEST - 2 VIEW  Comparison: Chest radiograph performed 06/15/2011  Findings: The lungs are hypoexpanded but appear grossly clear. Mild vascular crowding is noted.  There is no evidence of focal opacification, pleural effusion or pneumothorax.  The heart is borderline enlarged; the mediastinal contour is within normal limits.  No acute osseous abnormalities are seen.  Clips are noted within the right upper quadrant, reflecting prior cholecystectomy.  IMPRESSION: Hypoexpanded but grossly clear lungs; borderline cardiomegaly.  Original Report Authenticated By: Tonia Ghent, M.D.     1. Cough   2. Abdominal pain       MDM  Lungs are clear, abdomen is soft and benign other than mild tenderness in the epigastrium and left upper quadrant. Vital signs are normal including a pulse of 66, oxygen saturation of 99% and a blood pressure of 130/70. We'll begin evaluation with a two-view  chest x-ray to rule out pneumonia or pneumothorax, GI cocktail for abdominal pain, Tessalon for coughing. Will also give albuterol 5 mg due to patient's mild wheezing, history of asthma and cough.  Electronic medical records reviewed showing 2 visits in the last 6 weeks. Both of these visits contained complaints of either abdominal pain or abdominal pain and coughing. He has had a normal CT scan showing no acute findings and a chest x-ray from one week ago showing no acute findings, 2 view abdomen showing no acute findings. Again today his physical exam is fairly unremarkable and has a normal pulmonary exam. This is consistent with my interpretation of his x-ray of which I agree with the radiologist interpretation showing no acute findings. GI cocktail with no improvement of pain, 2  Percocet given. Of note I have not heard the patient cough since arrival.   Discharge Prescriptions include:  #1 Phenergan #2 Hyman Hopes, MD 06/22/11 0330

## 2011-09-29 ENCOUNTER — Inpatient Hospital Stay: Admit: 2011-09-29 | Payer: Self-pay | Admitting: Cardiovascular Disease

## 2011-09-30 DIAGNOSIS — R079 Chest pain, unspecified: Secondary | ICD-10-CM

## 2011-10-01 ENCOUNTER — Telehealth: Payer: Self-pay | Admitting: *Deleted

## 2011-10-01 ENCOUNTER — Encounter: Payer: Self-pay | Admitting: *Deleted

## 2011-10-01 DIAGNOSIS — R072 Precordial pain: Secondary | ICD-10-CM

## 2011-10-01 NOTE — Telephone Encounter (Signed)
Michelle PiperSidonie Dickens call Renold Don consult we did yesterday- DOB Oct 19, 1980 let him know stress ECHO is negative. No F/U  needed in our office.  Sent at 4:42 PM on Thursday

## 2011-10-02 ENCOUNTER — Inpatient Hospital Stay (HOSPITAL_COMMUNITY)
Admission: AD | Admit: 2011-10-02 | Discharge: 2011-10-03 | DRG: 287 | Disposition: A | Discharge status: Home / Self Care | Payer: Medicaid Other | Source: Other Acute Inpatient Hospital | Attending: Cardiology | Admitting: Cardiology

## 2011-10-02 ENCOUNTER — Encounter (HOSPITAL_COMMUNITY)
Admission: AD | Disposition: A | Discharge status: Home / Self Care | Payer: Self-pay | Source: Other Acute Inpatient Hospital | Attending: Cardiology

## 2011-10-02 ENCOUNTER — Encounter (HOSPITAL_COMMUNITY): Payer: Self-pay | Admitting: General Practice

## 2011-10-02 ENCOUNTER — Other Ambulatory Visit: Payer: Self-pay | Admitting: Cardiology

## 2011-10-02 DIAGNOSIS — I10 Essential (primary) hypertension: Secondary | ICD-10-CM | POA: Diagnosis present

## 2011-10-02 DIAGNOSIS — F329 Major depressive disorder, single episode, unspecified: Secondary | ICD-10-CM | POA: Diagnosis present

## 2011-10-02 DIAGNOSIS — R0789 Other chest pain: Principal | ICD-10-CM | POA: Diagnosis present

## 2011-10-02 DIAGNOSIS — G4730 Sleep apnea, unspecified: Secondary | ICD-10-CM | POA: Diagnosis present

## 2011-10-02 DIAGNOSIS — F3289 Other specified depressive episodes: Secondary | ICD-10-CM | POA: Diagnosis present

## 2011-10-02 DIAGNOSIS — R079 Chest pain, unspecified: Secondary | ICD-10-CM

## 2011-10-02 HISTORY — DX: Chest pain, unspecified: R07.9

## 2011-10-02 HISTORY — PX: LEFT HEART CATHETERIZATION WITH CORONARY ANGIOGRAM: SHX5451

## 2011-10-02 HISTORY — DX: Sleep apnea, unspecified: G47.30

## 2011-10-02 LAB — POCT ACTIVATED CLOTTING TIME: Activated Clotting Time: 111 seconds

## 2011-10-02 SURGERY — LEFT HEART CATHETERIZATION WITH CORONARY ANGIOGRAM
Anesthesia: LOCAL

## 2011-10-02 MED ORDER — HEPARIN (PORCINE) IN NACL 2-0.9 UNIT/ML-% IJ SOLN
INTRAMUSCULAR | Status: AC
  Filled 2011-10-02: qty 2000

## 2011-10-02 MED ORDER — MIDAZOLAM HCL 2 MG/2ML IJ SOLN
INTRAMUSCULAR | Status: AC
  Filled 2011-10-02: qty 2

## 2011-10-02 MED ORDER — OXYCODONE-ACETAMINOPHEN 5-325 MG PO TABS
1.0000 | ORAL_TABLET | ORAL | Status: DC | PRN
  Administered 2011-10-02 – 2011-10-03 (×2): 2 via ORAL
  Filled 2011-10-02 (×2): qty 2

## 2011-10-02 MED ORDER — SERTRALINE HCL 100 MG PO TABS
200.0000 mg | ORAL_TABLET | Freq: Every day | ORAL | Status: DC
  Administered 2011-10-02 – 2011-10-03 (×2): 200 mg via ORAL
  Filled 2011-10-02 (×3): qty 2

## 2011-10-02 MED ORDER — FENTANYL CITRATE 0.05 MG/ML IJ SOLN
INTRAMUSCULAR | Status: AC
  Filled 2011-10-02: qty 2

## 2011-10-02 MED ORDER — LAMOTRIGINE 200 MG PO TABS
200.0000 mg | ORAL_TABLET | Freq: Two times a day (BID) | ORAL | Status: DC
  Administered 2011-10-02 – 2011-10-03 (×2): 200 mg via ORAL
  Filled 2011-10-02 (×3): qty 1

## 2011-10-02 MED ORDER — SUCRALFATE 1 G PO TABS
1.0000 g | ORAL_TABLET | Freq: Two times a day (BID) | ORAL | Status: DC
  Administered 2011-10-02 – 2011-10-03 (×2): 1 g via ORAL
  Filled 2011-10-02 (×3): qty 1

## 2011-10-02 MED ORDER — LIDOCAINE HCL (PF) 1 % IJ SOLN
INTRAMUSCULAR | Status: AC
  Filled 2011-10-02: qty 30

## 2011-10-02 MED ORDER — PANTOPRAZOLE SODIUM 20 MG PO TBEC
20.0000 mg | DELAYED_RELEASE_TABLET | Freq: Every day | ORAL | Status: DC
  Administered 2011-10-02 – 2011-10-03 (×2): 20 mg via ORAL
  Filled 2011-10-02 (×3): qty 1

## 2011-10-02 MED ORDER — ACETAMINOPHEN 325 MG PO TABS: 650.0000 mg | ORAL_TABLET | ORAL | Status: DC | PRN

## 2011-10-02 MED ORDER — ONDANSETRON HCL 4 MG/2ML IJ SOLN: 4.0000 mg | Freq: Four times a day (QID) | INTRAMUSCULAR | Status: DC | PRN

## 2011-10-02 MED ORDER — NITROGLYCERIN 0.2 MG/ML ON CALL CATH LAB
INTRAVENOUS | Status: AC
  Filled 2011-10-02: qty 1

## 2011-10-02 MED ORDER — DIAZEPAM 2 MG PO TABS
2.0000 mg | ORAL_TABLET | ORAL | Status: DC | PRN
  Administered 2011-10-02: 2 mg via ORAL
  Filled 2011-10-02: qty 1

## 2011-10-02 MED ORDER — SODIUM CHLORIDE 0.45 % IV SOLN
INTRAVENOUS | Status: AC
  Administered 2011-10-02: 18:00:00 via INTRAVENOUS

## 2011-10-02 MED ORDER — ALBUTEROL SULFATE HFA 108 (90 BASE) MCG/ACT IN AERS
2.0000 | INHALATION_SPRAY | Freq: Four times a day (QID) | RESPIRATORY_TRACT | Status: DC | PRN
  Filled 2011-10-02: qty 6.7

## 2011-10-02 MED ORDER — LORATADINE 10 MG PO TABS
10.0000 mg | ORAL_TABLET | Freq: Every day | ORAL | Status: DC
  Administered 2011-10-02 – 2011-10-03 (×2): 10 mg via ORAL
  Filled 2011-10-02 (×3): qty 1

## 2011-10-02 NOTE — CV Procedure (Signed)
      Catheterization   Indication: Chest Pain  Procedure: After informed consent and clinical "time out" the right groin was prepped and draped in a sterile fashion.  A 5Fr sheath was placed in the right femoral artery using seldinger technique and local lidocaine.  Standard JL4, JR4 and angled pigtail catheters were used to engage the coronary arteries.  Coronary arteries were visualized in orthogonal views using caudal and cranial angulation.  RAO ventriculography was done using 22* cc of contrast.    Medications:   Versed: 2 mg's  Fentanyl: 25 ug's  Coronary Arteries: Right dominant with no anomalies  LM: Normal  LAD: Normal    IM: Normal  D1: Normal   Circumflex: Normal   OM1: Normal  OM2: Normal  RCA: The RCA was very diificult to inject without catheter tip spasm or damping in a large RV branch.  We tried multipurpos, AR1, JR4 and JR3.5 catheters.   With the latter catheter we were able to get a diagnostic injection of the RCA with no spasm or damping and it appeared normal     PDA: normal  PLA: Normal  Ventriculography: EF: 55 %, no RWMA's  Hemodynamics:  Aortic Pressure: 117 77 mmHg  LV Pressure: 115 16  mmHg  Impression:  No significant CAD.  Catheter tip spasm of RCA.  Pain non cardiac.  Patient indicated 8/10 pain during case despite normal cors and LV Ascending aortic root normal on ventriculography.  D/C in a.m. If groin stable  Charlton Haws 10/02/2011 3:00 PM

## 2011-10-02 NOTE — H&P (Signed)
NAME:  Chris Spencer, Chris Spencer ROOM: 228  UNIT NUMBER:  [id number] LOCATION: 2F 228 01 ADM/VISIT DATE:  09/29/2011   ADM PHYS:  ACCT:  [id number] DOB: 12/26/1980   REFERRING PHYSICIAN:  [name], M.D.  SUMMARY:  The patient is a 31-year-old male with a prior history of single-vessel coronary artery disease by his history in [place] with possible balloon angioplasty.  This occurred with sudden onset of chest pain.  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  The patient states that the pain lasted approximately 5 minutes but was very strong in nature but without any radiation.  He was very concerned about this.  He was admitted to be ruled out for myocardial infarction.  EKG's have been negative.  A bedside echocardiogram reveals no segmental wall motion abnormalities but significant right ventricular enlargement.  The patient does have history of obstructive sleep apnea.  CT of the chest was done to rule out pulmonary embolism and this was negative.  PLAN:  Obtain a dobutamine echocardiogram in the morning and if negative then the patient can be treated for pleuritic chest pain and he has been ruled out for pulmonary embolism.  If, however, the stress test is positive, we will proceed with cardiac catheterization given his strong family history also of coronary artery disease both in his brother and his mother.   __________________________    Jorja Empie, M.D. /landm D: 09/30/2011 1652 T: 09/30/2011 1659 P: DEG  cc:  [name], M.D.  Full H&P will follow. In summary the patient continued to have chest pain during this hospitalization. He ruled out for myocardial infarction by enzymes and a stress echocardiogram was done which was completely within normal limits. Actually this was a dobutamine echocardiogram and images were of good quality and there were no wall motion abnormalities. Unfortunately his  primary care physician drew cardiac troponins right after the stress test and before the stress test all troponins were normal couple of hours after the stress test there were 2 sets of troponins done within one hour apart that were respectively 1.0 and 0.94. Subsequent sets were again all normal. There were no acute EKG changes while the patient was having chest pain. Also no EKG changes were seen on the EKG during dobutamine infusion despite ongoing chest pain. I suspect the patient may have some drug seeking behavior he actually asked me for a Dilaudid this morning because he felt that morphine was a strong enough. On physical examination the patient actually has reproducible chest pain on palpation. However because of his prior history of possible myocardial infarction with PCI in [place] several years ago and with a slight increase in troponin after stress testing and ongoing pain, it was the patient's preference to proceed with a diagnostic cardiac catheterization. I discussed risks and benefits of the procedure with the patient.  I discussed the risks and benefits of a diagnostic cardiac catheterization with the patient.   We also discussed the radial versus femoral approach.  In particular I quoted that the risk of bleeding from the radial artery is usually less than 1%.  I also explained however that the procedure is more challenging for the cardiologist and does involve a slightly greater radiation exposure although scientist estimate that increased radiation is equivalent to 20 chest x-rays representing only a small risk of the patient.   The following general risks were quoted to the patient for a   diagnostic cardiac catheterization:    

## 2011-10-02 NOTE — H&P (View-Only) (Signed)
NAME:  Chris Spencer, KINDRED ROOM: 228  UNIT NUMBER:  [id number] LOCATION: 73F 228 01 ADM/VISIT DATE:  09/29/2011   ADM PHYSKathaleen Grinder:  [id number] DOB: Aug 01, 1980   REFERRING PHYSICIAN:  Lia Hopping, M.D.  SUMMARY:  The patient is a 32 year old male with a prior history of single-vessel coronary artery disease by his history in [place] with possible balloon angioplasty.  This occurred with sudden onset of chest pain.  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  The patient states that the pain lasted approximately 5 minutes but was very strong in nature but without any radiation.  He was very concerned about this.  He was admitted to be ruled out for myocardial infarction.  EKG's have been negative.  A bedside echocardiogram reveals no segmental wall motion abnormalities but significant right ventricular enlargement.  The patient does have history of obstructive sleep apnea.  CT of the chest was done to rule out pulmonary embolism and this was negative.  PLAN:  Obtain a dobutamine echocardiogram in the morning and if negative then the patient can be treated for pleuritic chest pain and he has been ruled out for pulmonary embolism.  If, however, the stress test is positive, we will proceed with cardiac catheterization given his strong family history also of coronary artery disease both in his brother and his mother.   __________________________    Lewayne Bunting, M.D. [name] D: 09/30/2011 1652 T: 09/30/2011 1659 P: DEG  cc:  Lia Hopping, M.D.  Full H&P will follow. In summary the patient continued to have chest pain during this hospitalization. He ruled out for myocardial infarction by enzymes and a stress echocardiogram was done which was completely within normal limits. Actually this was a dobutamine echocardiogram and images were of good quality and there were no wall motion abnormalities. Unfortunately his  primary care physician drew cardiac troponins right after the stress test and before the stress test all troponins were normal couple of hours after the stress test there were 2 sets of troponins done within one hour apart that were respectively 1.0 and 0.94. Subsequent sets were again all normal. There were no acute EKG changes while the patient was having chest pain. Also no EKG changes were seen on the EKG during dobutamine infusion despite ongoing chest pain. I suspect the patient may have some drug seeking behavior he actually asked me for a Dilaudid this morning because he felt that morphine was a strong enough. On physical examination the patient actually has reproducible chest pain on palpation. However because of his prior history of possible myocardial infarction with PCI in [place] several years ago and with a slight increase in troponin after stress testing and ongoing pain, it was the patient's preference to proceed with a diagnostic cardiac catheterization. I discussed risks and benefits of the procedure with the patient.  I discussed the risks and benefits of a diagnostic cardiac catheterization with the patient.   We also discussed the radial versus femoral approach.  In particular I quoted that the risk of bleeding from the radial artery is usually less than 1%.  I also explained however that the procedure is more challenging for the cardiologist and does involve a slightly greater radiation exposure although scientist estimate that increased radiation is equivalent to 20 chest x-rays representing only a small risk of the patient.   The following general risks were quoted to the patient for a  diagnostic cardiac catheterization:

## 2011-10-02 NOTE — Op Note (Signed)
See CV procedure note  Jahne Krukowski  

## 2011-10-02 NOTE — Interval H&P Note (Signed)
History and Physical Interval Note:  10/02/2011 2:25 PM  Chris Spencer  has presented today for surgery, with the diagnosis of Chest pain  The various methods of treatment have been discussed with the patient and family. After consideration of risks, benefits and other options for treatment, the patient has consented to  Procedure(s) (LRB): LEFT HEART CATHETERIZATION WITH CORONARY ANGIOGRAM (N/A) as a surgical intervention .  The patients' history has been reviewed, patient examined, no change in status, stable for surgery.  I have reviewed the patients' chart and labs.  Questions were answered to the patient's satisfaction.     Charlton Haws  Patient examined chart reviewed.  Patient does not want case done radially.   2:26 PM 10/02/2011

## 2011-10-03 ENCOUNTER — Encounter (HOSPITAL_COMMUNITY): Payer: Self-pay | Admitting: Nurse Practitioner

## 2011-10-03 DIAGNOSIS — R079 Chest pain, unspecified: Secondary | ICD-10-CM

## 2011-10-03 LAB — CBC
MCH: 29.5 pg (ref 26.0–34.0)
MCV: 85.1 fL (ref 78.0–100.0)
Platelets: 200 10*3/uL (ref 150–400)
RDW: 12.5 % (ref 11.5–15.5)

## 2011-10-03 LAB — BASIC METABOLIC PANEL
Calcium: 9.7 mg/dL (ref 8.4–10.5)
Creatinine, Ser: 1.07 mg/dL (ref 0.50–1.35)
GFR calc Af Amer: >90 mL/min (ref >90)

## 2011-10-03 NOTE — Discharge Summary (Signed)
Patient ID: Chris Spencer,  MRN: [id number], DOB/AGE: 28-Feb-1981 31 y.o.  Admit date: 10/02/2011 Discharge date: 10/03/2011  Primary Care Provider: Toma Deiters, MD Primary Cardiologist: G. Degent MD  Discharge Diagnoses Principal Problem:  *Chest pain with normal coronary angiography Active Problems:  Sleep apnea  Hypertension  Depression   Allergies Allergies  Allergen Reactions  . Bee Venom Anaphylaxis  . Meloxicam Hives  . Penicillins Hives  . Sulfa Antibiotics Hives  . Ketorolac Tromethamine Itching    Procedures  Cardiac Catheterization 10/02/2011  Coronary Arteries: Right dominant with no anomalies  LM: Normal LAD: Normal             IM: Normal             D1: Normal Circumflex: Normal            OM1: Normal             OM2: Normal RCA: The RCA was very diificult to inject without catheter tip spasm or damping in a large RV branch.  We tried multipurpos, AR1, JR4 and JR3.5 catheters.    With the latter catheter we were able to get a diagnostic injection of the RCA with no spasm or damping and it appeared normal             PDA: normal             PLA: Normal  Ventriculography: EF: 55 %, no RWMA's _____________  History of Present Illness  31 y/o male with the above problem list.  He was recently admitted to [facility] secondary to chest pain.  A bedside echo showed normal LV function and cardiac markers were normal.  He continued to complain of chest pain and he underwent a dobutamine echo, during which, he had no acute st/t changes.  Following dobutamine echo, cardiac markers were recycled and returned elevated @ 1.0 then 0.94.  Subsequent sets were normal.  Because of ongoing complaints of chest pain (pt asking for Dilaudid by name), decision was made to pursue diagnostic cath and pt was transferred to [facility] for further eval.  Hospital Course  Pt underwent diagnostic cath on 5/3 revealing normal coronary arteries.  He was observed overnight and has  been ambulating this AM without difficulty.  He will be discharged home today in good condition.  Discharge Vitals Blood pressure 101/52, pulse 61, temperature 98.3 F (36.8 C), resp. rate 16, height 5\' 9"  (1.753 m), weight 247 lb (112.038 kg), SpO2 98.00%.  Filed Weights   10/03/11 0500  Weight: 247 lb (112.038 kg)    Labs  CBC  Basename 10/03/11 0530  WBC 6.6  NEUTROABS --  HGB 13.3  HCT 38.4*  MCV 85.1  PLT 200   Basic Metabolic Panel  Basename 10/03/11 0530  NA 139  K 3.6  CL 101  CO2 31  GLUCOSE 111*  BUN 13  CREATININE 1.07  CALCIUM 9.7  MG --  PHOS --   Disposition  Pt is being discharged home today in good condition.  Follow-up Plans & Appointments  Follow-up Information    Follow up with HASANAJ,XAJE A, MD in 2 weeks.         Discharge Medications  Medication List  As of 10/03/2011 12:51 PM   TAKE these medications         albuterol 108 (90 BASE) MCG/ACT inhaler   Commonly known as: PROVENTIL HFA;VENTOLIN HFA   Inhale 2 puffs into the lungs every 6 (six) hours as needed.  For shortness of breath      EPINEPHrine 0.15 MG/0.3ML injection   Commonly known as: EPIPEN JR   Inject 0.15 mg into the muscle as needed.      lamoTRIgine 100 MG tablet   Commonly known as: LAMICTAL   Take 200 mg by mouth 2 (two) times daily.      lisinopril-hydrochlorothiazide 20-12.5 MG per tablet   Commonly known as: PRINZIDE,ZESTORETIC   Take 1 tablet by mouth daily.      loratadine 10 MG tablet   Commonly known as: CLARITIN   Take 10 mg by mouth daily.      multivitamin with minerals tablet   Take 1 tablet by mouth daily.      pantoprazole 20 MG tablet   Commonly known as: PROTONIX   Take 1 tablet (20 mg total) by mouth daily.      sertraline 100 MG tablet   Commonly known as: ZOLOFT   Take 200 mg by mouth daily.      sucralfate 1 G tablet   Commonly known as: CARAFATE   Take 1 g by mouth 2 (two) times daily.      SUMAtriptan 100 MG tablet    Commonly known as: IMITREX   Take 100 mg by mouth every 2 (two) hours as needed. For migraine            Outstanding Labs/Studies  None  Duration of Discharge Encounter   Greater than 30 minutes including physician time.  Signed, Nicolasa Ducking NP 10/03/2011, 12:51 PM   Jesse Sans. Daleen Squibb, MD, [facility] Pager:  [phone]

## 2011-10-03 NOTE — Discharge Instructions (Signed)
***  PLEASE REMEMBER TO BRING ALL OF YOUR MEDICATIONS TO EACH OF YOUR FOLLOW-UP OFFICE VISITS.  NO HEAVY LIFTING OR SEXUAL ACTIVITY X 7 DAYS. NO DRIVING X 2-3 DAYS. NO SOAKING BATHS, HOT TUBS, POOLS, ETC., X 7 DAYS.  

## 2011-10-03 NOTE — Progress Notes (Signed)
Patient ID: Chris Spencer, male   DOB: 1981-02-09, 31 y.o.   MRN: [id number]   Patient Name: Chris Spencer Date of Encounter: 10/03/2011    SUBJECTIVE  No further CP. Wants to go home.  CURRENT MEDS    . fentaNYL      . heparin      . lamoTRIgine  200 mg Oral BID  . lidocaine      . loratadine  10 mg Oral Daily  . midazolam      . nitroGLYCERIN      . pantoprazole  20 mg Oral Daily  . sertraline  200 mg Oral Daily  . sucralfate  1 g Oral BID    OBJECTIVE  Filed Vitals:   10/02/11 1830 10/02/11 2130 10/03/11 0500 10/03/11 0600  BP: 124/65 128/63  101/52  Pulse: 85 91  61  Temp:  98 F (36.7 C)  98.3 F (36.8 C)  Resp:  18  16  Height:   5\' 9"  (1.753 m)   Weight:   247 lb (112.038 kg)   SpO2: 99% 95%  98%   No intake or output data in the 24 hours ending 10/03/11 1142 Filed Weights   10/03/11 0500  Weight: 247 lb (112.038 kg)    PHYSICAL EXAM  General: Pleasant, NAD, obese Neuro: Alert and oriented X 3. Moves all extremities spontaneously. Psych: Normal affect. HEENT:  Normal  Neck: Supple without bruits or JVD. Lungs:  Resp regular and unlabored, CTA. Heart: RRR no s3, s4, or murmurs. Abdomen: Soft, non-tender, non-distended, BS + x 4.  Extremities: No clubbing, cyanosis or edema. DP/PT/Radials 2+ and equal bilaterally, right groin is stable.  Accessory Clinical Findings  CBC  Basename 10/03/11 0530  WBC 6.6  NEUTROABS --  HGB 13.3  HCT 38.4*  MCV 85.1  PLT 200   Basic Metabolic Panel  Basename 10/03/11 0530  NA 139  K 3.6  CL 101  CO2 31  GLUCOSE 111*  BUN 13  CREATININE 1.07  CALCIUM 9.7  MG --  PHOS --   Liver Function Tests No results found for this basename: AST:2,ALT:2,ALKPHOS:2,BILITOT:2,PROT:2,ALBUMIN:2 in the last 72 hours No results found for this basename: LIPASE:2,AMYLASE:2 in the last 72 hours Cardiac Enzymes No results found for this basename: CKTOTAL:3,CKMB:3,CKMBINDEX:3,TROPONINI:3 in the last 72 hours BNP No  components found with this basename: POCBNP:3 D-Dimer No results found for this basename: DDIMER:2 in the last 72 hours Hemoglobin A1C No results found for this basename: HGBA1C in the last 72 hours Fasting Lipid Panel No results found for this basename: CHOL,HDL,LDLCALC,TRIG,CHOLHDL,LDLDIRECT in the last 72 hours Thyroid Function Tests No results found for this basename: TSH,T4TOTAL,FREET3,T3FREE,THYROIDAB in the last 72 hours  TELE  NSR  ECG    Radiology/Studies  No results found.  ASSESSMENT AND PLAN     Non cardiac CP. Discharge today with followupm with his PCP Dr Bradly Bienenstock. Advised to lose weight and to increase exercise. May need FLP.  Signed, Valera Castle MD

## 2011-10-07 DIAGNOSIS — G4730 Sleep apnea, unspecified: Secondary | ICD-10-CM | POA: Insufficient documentation

## 2011-10-07 DIAGNOSIS — R112 Nausea with vomiting, unspecified: Secondary | ICD-10-CM | POA: Insufficient documentation

## 2011-10-07 DIAGNOSIS — R109 Unspecified abdominal pain: Secondary | ICD-10-CM | POA: Insufficient documentation

## 2011-10-07 DIAGNOSIS — Z87891 Personal history of nicotine dependence: Secondary | ICD-10-CM | POA: Insufficient documentation

## 2011-10-07 DIAGNOSIS — Z87442 Personal history of urinary calculi: Secondary | ICD-10-CM | POA: Insufficient documentation

## 2011-10-07 DIAGNOSIS — J45909 Unspecified asthma, uncomplicated: Secondary | ICD-10-CM | POA: Insufficient documentation

## 2011-10-07 DIAGNOSIS — R300 Dysuria: Secondary | ICD-10-CM | POA: Insufficient documentation

## 2011-10-07 DIAGNOSIS — K219 Gastro-esophageal reflux disease without esophagitis: Secondary | ICD-10-CM | POA: Insufficient documentation

## 2011-10-07 DIAGNOSIS — I10 Essential (primary) hypertension: Secondary | ICD-10-CM | POA: Insufficient documentation

## 2011-10-07 DIAGNOSIS — R569 Unspecified convulsions: Secondary | ICD-10-CM | POA: Insufficient documentation

## 2011-10-08 ENCOUNTER — Emergency Department (HOSPITAL_COMMUNITY): Payer: Medicaid Other

## 2011-10-08 ENCOUNTER — Encounter (HOSPITAL_COMMUNITY): Payer: Self-pay

## 2011-10-08 ENCOUNTER — Emergency Department (HOSPITAL_COMMUNITY)
Admission: EM | Admit: 2011-10-08 | Discharge: 2011-10-08 | Disposition: A | Discharge status: Home / Self Care | Payer: Medicaid Other | Attending: Emergency Medicine | Admitting: Emergency Medicine

## 2011-10-08 DIAGNOSIS — R109 Unspecified abdominal pain: Secondary | ICD-10-CM

## 2011-10-08 HISTORY — DX: Calculus of kidney: N20.0

## 2011-10-08 MED ORDER — ONDANSETRON HCL 4 MG/2ML IJ SOLN
4.0000 mg | Freq: Once | INTRAMUSCULAR | Status: AC
  Administered 2011-10-08: 4 mg via INTRAVENOUS
  Filled 2011-10-08: qty 2

## 2011-10-08 MED ORDER — HYDROMORPHONE HCL PF 1 MG/ML IJ SOLN
1.0000 mg | Freq: Once | INTRAMUSCULAR | Status: AC
  Administered 2011-10-08: 1 mg via INTRAVENOUS
  Filled 2011-10-08: qty 1

## 2011-10-08 MED ORDER — PROMETHAZINE HCL 25 MG PO TABS: 12.5000 mg | ORAL_TABLET | Freq: Four times a day (QID) | ORAL | Status: DC | PRN

## 2011-10-08 MED ORDER — KETOROLAC TROMETHAMINE 30 MG/ML IJ SOLN: 30.0000 mg | Freq: Once | INTRAMUSCULAR | Status: DC

## 2011-10-08 NOTE — Discharge Instructions (Signed)
The CT does not show any kidney stones. Use tylenol or ibuprofen for discomfort. Use the nausea medicine as needed. Follow up with your doctor.   Abdominal Pain Many things can cause belly (abdominal) pain. Most times, the belly pain is not dangerous. The amount of belly pain does not tell how serious the problem may be. Many cases of belly pain can be watched and treated at home. HOME CARE   Do not take medicines that help you go poop (laxatives) unless told to by your doctor.   Only take medicine as told by your doctor.   Eat or drink as told by your doctor. Your doctor will tell you if you should be on a special diet.  GET HELP RIGHT AWAY IF:   The pain does not go away.   You have a fever.   You keep throwing up (vomiting).   The pain changes and is only in the right or left part of the belly.   You have bloody or tarry looking poop.  MAKE SURE YOU:   Understand these instructions.   Will watch your condition.   Will get help right away if you are not doing well or get worse.  Document Released: 11/04/2007 Document Revised: 05/07/2011 Document Reviewed: 06/03/2009 [facility] Patient Information 2012 [place], [place].

## 2011-10-08 NOTE — ED Notes (Signed)
Nausea, vomiting, right flank pain, history of kidney stones per pt.

## 2011-10-08 NOTE — ED Notes (Signed)
Into room to assess patient. States he started having right flank pain since 1700 this evening. Has passed two kidney stones since this morning. Denies any hematuria or dysuria. States it burns when he urinates. Nothing makes pain better or worse. Is a constant, sharp pain.

## 2011-10-08 NOTE — ED Notes (Signed)
Given ice water per request. Denies needs. Call bell within reach. Denies needs. Bed in low position and locked with side rails up. Call bell within reach.

## 2011-10-08 NOTE — ED Provider Notes (Signed)
History     CSN: [id number]  Arrival date & time 10/07/11  2357   First MD Initiated Contact with Patient 10/08/11 0011      Chief Complaint  Patient presents with  . Flank Pain  . Emesis    (Consider location/radiation/quality/duration/timing/severity/associated sxs/prior treatment) HPI Chris Spencer is a 31 y.o. male  With a history of kidney stones, hypertension, seizures, asthma,GERD, depression, diverticulosis,who presents to the Emergency Department complaining of right-sided flank pain that began at 4 PM today that radiates around the right side of his abdomen. It is associated with dysuria. He denies fever, chills, diarrhea, hematuria. He has had vomiting.  PCP Dr. Olena Leatherwood   Past Medical History  Diagnosis Date  . Diverticulosis   . Hypertension   . Seizures   . Asthma   . Acid reflux   . Migraines   . Depression   . Mood swings   . Chest pain with normal coronary angiography     a.  09/2011 Cath:  Normal Cors, NL LV fxn  . Sleep apnea     a. On CPAP  . Kidney stones     Past Surgical History  Procedure Date  . Hernia repair   . Cholecystectomy   . Tonsillectomy   . Neck surgery   . Wrist fusion left  . Cardiac catheterization     History reviewed. No pertinent family history.  History  Substance Use Topics  . Smoking status: Former Games developer  . Smokeless tobacco: Never Used  . Alcohol Use: No      Review of Systems  Constitutional: Negative for fever.       10 Systems reviewed and are negative for acute change except as noted in the HPI.  HENT: Negative for congestion.   Eyes: Negative for discharge and redness.  Respiratory: Negative for cough and shortness of breath.   Cardiovascular: Negative for chest pain.  Gastrointestinal: Positive for nausea and vomiting. Negative for abdominal pain.  Genitourinary: Positive for dysuria and flank pain.  Musculoskeletal: Negative for back pain.  Skin: Negative for rash.  Neurological: Negative for  syncope, numbness and headaches.  Psychiatric/Behavioral:       No behavior change.    Allergies  Bee venom; Meloxicam; Penicillins; Sulfa antibiotics; and Ketorolac tromethamine  Home Medications   Current Outpatient Rx  Name Route Sig Dispense Refill  . ALBUTEROL SULFATE HFA 108 (90 BASE) MCG/ACT IN AERS Inhalation Inhale 2 puffs into the lungs every 6 (six) hours as needed. For shortness of breath    . LAMOTRIGINE 100 MG PO TABS Oral Take 200 mg by mouth 2 (two) times daily.     Marland Kitchen LISINOPRIL-HYDROCHLOROTHIAZIDE 20-12.5 MG PO TABS Oral Take 1 tablet by mouth daily.      Marland Kitchen LORATADINE 10 MG PO TABS Oral Take 10 mg by mouth daily.      . MULTI-VITAMIN/MINERALS PO TABS Oral Take 1 tablet by mouth daily.      Marland Kitchen PANTOPRAZOLE SODIUM 20 MG PO TBEC Oral Take 1 tablet (20 mg total) by mouth daily. 30 tablet 0  . SERTRALINE HCL 100 MG PO TABS Oral Take 200 mg by mouth daily.      . SUCRALFATE 1 G PO TABS Oral Take 1 g by mouth 2 (two) times daily.      Marland Kitchen EPINEPHRINE 0.15 MG/0.3ML IJ DEVI Intramuscular Inject 0.15 mg into the muscle as needed.     . SUMATRIPTAN SUCCINATE 100 MG PO TABS Oral Take 100 mg by mouth  every 2 (two) hours as needed. For migraine      BP 105/57  Pulse 80  Temp(Src) 97.6 F (36.4 C) (Oral)  Resp 18  Ht 5\' 9"  (1.753 m)  Wt 243 lb (110.224 kg)  BMI 35.88 kg/m2  SpO2 96%  Physical Exam  Nursing note and vitals reviewed. Constitutional:       Awake, alert, nontoxic appearance.  HENT:  Head: Atraumatic.  Eyes: Right eye exhibits no discharge. Left eye exhibits no discharge.  Neck: Neck supple.  Pulmonary/Chest: Effort normal. He exhibits no tenderness.  Abdominal: Soft. There is no tenderness. There is no rebound.  Genitourinary:       Right cva tenderness  Musculoskeletal: He exhibits no tenderness.       Baseline ROM, no obvious new focal weakness.  Neurological:       Mental status and motor strength appears baseline for patient and situation.  Skin: No  rash noted.  Psychiatric: He has a normal mood and affect.    ED Course  Procedures (including critical care time)  Ct Abdomen Pelvis Wo Contrast  10/08/2011  *RADIOLOGY REPORT*  Clinical Data: Right flank pain and dysuria.  CT ABDOMEN AND PELVIS WITHOUT CONTRAST  Technique:  Multidetector CT imaging of the abdomen and pelvis was performed following the standard protocol without intravenous contrast.  Comparison: CT of the abdomen and pelvis performed 09/22/2011  Findings: The visualized lung bases are clear.  The liver and spleen are unremarkable in appearance.  The patient is status post cholecystectomy, with clips noted at the gallbladder fossa.  The pancreas and adrenal glands are unremarkable.  The kidneys are unremarkable in appearance.  There is no evidence of hydronephrosis.  No renal or ureteral stones are seen.  No perinephric stranding is appreciated.  No free fluid is identified.  The small bowel is unremarkable in appearance.  The stomach is within normal limits.  No acute vascular abnormalities are seen.  The appendix is normal in caliber, without evidence for appendicitis.  Minimal diverticulosis is noted along the proximal sigmoid colon; the colon is otherwise unremarkable in appearance.  The bladder is mildly distended and grossly unremarkable.  The prostate remains normal in size.  No inguinal lymphadenopathy is seen.  No acute osseous abnormalities are identified.  IMPRESSION:  1.  No acute abnormalities seen within the abdomen or pelvis. 2.  Minimal diverticulosis along the proximal sigmoid colon; no evidence of diverticulitis.  Original Report Authenticated By: Tonia Ghent, M.D.     MDM  Patient with a history of kidney stones here with nausea, vomiting, right flank pain. He is told 3 different stories as to when he may have passed stones, one month ago, 1 week ago and that he had passed stones today.CT of the abdomen and pelvis shows no acute abnormalities, no kidney or ureteral  stones. She has been given IV fluids, anti-medic, analgesics.Pt stable in ED with no significant deterioration in condition.The patient appears reasonably screened and/or stabilized for discharge and I doubt any other medical condition or other [facility] requiring further screening, evaluation, or treatment in the ED at this time prior to discharge.  MDM Reviewed: nursing note and vitals Interpretation: labs and CT scan           Nicoletta Dress. Colon Branch, MD 10/08/11 2536

## 2011-10-08 NOTE — ED Notes (Signed)
Patient back to room from radiology. Pain 10\10 at this time. Would like something else for pain. Notified can't given urine specimen at this time. Call bell within reach. MD aware.

## 2011-10-13 DIAGNOSIS — R55 Syncope and collapse: Secondary | ICD-10-CM

## 2011-10-13 DIAGNOSIS — R079 Chest pain, unspecified: Secondary | ICD-10-CM

## 2011-10-14 ENCOUNTER — Telehealth: Payer: Self-pay | Admitting: *Deleted

## 2011-10-14 NOTE — Telephone Encounter (Signed)
Left message for patient to call office on voice mail 

## 2011-10-14 NOTE — Telephone Encounter (Signed)
Left message for patient to call office to verify patient address, phone numbers and insurance for monitor that will be ordered.

## 2011-10-15 ENCOUNTER — Other Ambulatory Visit: Payer: Self-pay | Admitting: *Deleted

## 2011-10-15 ENCOUNTER — Telehealth: Payer: Self-pay | Admitting: *Deleted

## 2011-10-15 NOTE — Telephone Encounter (Signed)
Patient called to return call in reference to ordering a monitor?  I could not tell who to send this message to.

## 2011-10-15 NOTE — Telephone Encounter (Signed)
Spoke with mother and verified insurance, address and phone numbers.

## 2011-10-15 NOTE — Telephone Encounter (Signed)
Addended by: Eustace Moore on: 10/15/2011 08:07 AM   Modules accepted: Orders

## 2011-10-16 ENCOUNTER — Encounter (HOSPITAL_COMMUNITY): Payer: Self-pay | Admitting: Emergency Medicine

## 2011-10-16 ENCOUNTER — Emergency Department (HOSPITAL_COMMUNITY)
Admission: EM | Admit: 2011-10-16 | Discharge: 2011-10-16 | Disposition: A | Discharge status: Home / Self Care | Payer: Medicaid Other | Attending: Emergency Medicine | Admitting: Emergency Medicine

## 2011-10-16 ENCOUNTER — Emergency Department (HOSPITAL_COMMUNITY): Payer: Medicaid Other

## 2011-10-16 DIAGNOSIS — G4730 Sleep apnea, unspecified: Secondary | ICD-10-CM | POA: Insufficient documentation

## 2011-10-16 DIAGNOSIS — I10 Essential (primary) hypertension: Secondary | ICD-10-CM | POA: Insufficient documentation

## 2011-10-16 DIAGNOSIS — R0602 Shortness of breath: Secondary | ICD-10-CM | POA: Insufficient documentation

## 2011-10-16 DIAGNOSIS — K219 Gastro-esophageal reflux disease without esophagitis: Secondary | ICD-10-CM | POA: Insufficient documentation

## 2011-10-16 DIAGNOSIS — G43909 Migraine, unspecified, not intractable, without status migrainosus: Secondary | ICD-10-CM | POA: Insufficient documentation

## 2011-10-16 DIAGNOSIS — J45909 Unspecified asthma, uncomplicated: Secondary | ICD-10-CM | POA: Insufficient documentation

## 2011-10-16 DIAGNOSIS — Z87442 Personal history of urinary calculi: Secondary | ICD-10-CM | POA: Insufficient documentation

## 2011-10-16 DIAGNOSIS — F3289 Other specified depressive episodes: Secondary | ICD-10-CM | POA: Insufficient documentation

## 2011-10-16 DIAGNOSIS — R079 Chest pain, unspecified: Secondary | ICD-10-CM

## 2011-10-16 DIAGNOSIS — R0789 Other chest pain: Secondary | ICD-10-CM | POA: Insufficient documentation

## 2011-10-16 DIAGNOSIS — K5730 Diverticulosis of large intestine without perforation or abscess without bleeding: Secondary | ICD-10-CM | POA: Insufficient documentation

## 2011-10-16 DIAGNOSIS — F329 Major depressive disorder, single episode, unspecified: Secondary | ICD-10-CM | POA: Insufficient documentation

## 2011-10-16 LAB — DIFFERENTIAL
Basophils Absolute: 0 10*3/uL (ref 0.0–0.1)
Basophils Relative: 0 % (ref 0–1)
Eosinophils Absolute: 0.4 10*3/uL (ref 0.0–0.7)
Monocytes Absolute: 0.9 10*3/uL (ref 0.1–1.0)
Neutro Abs: 5.9 10*3/uL (ref 1.7–7.7)
Neutrophils Relative %: 57 % (ref 43–77)

## 2011-10-16 LAB — BASIC METABOLIC PANEL
Chloride: 101 mEq/L (ref 96–112)
Creatinine, Ser: 0.96 mg/dL (ref 0.50–1.35)
GFR calc Af Amer: >90 mL/min (ref >90)
GFR calc non Af Amer: >90 mL/min (ref >90)

## 2011-10-16 LAB — CBC
HCT: 40.8 % (ref 39.0–52.0)
MCH: 30.3 pg (ref 26.0–34.0)
MCHC: 36.3 g/dL — ABNORMAL HIGH (ref 30.0–36.0)
RDW: 12.7 % (ref 11.5–15.5)

## 2011-10-16 LAB — POCT I-STAT TROPONIN I: Troponin i, poc: 0 ng/mL (ref 0.00–0.08)

## 2011-10-16 MED ORDER — LIDOCAINE 5 % EX PTCH
1.0000 | MEDICATED_PATCH | Freq: Once | CUTANEOUS | Status: DC
  Administered 2011-10-16: 1 via TRANSDERMAL
  Filled 2011-10-16: qty 1

## 2011-10-16 MED ORDER — NITROGLYCERIN 0.4 MG SL SUBL
0.4000 mg | SUBLINGUAL_TABLET | SUBLINGUAL | Status: AC | PRN
  Administered 2011-10-16 (×3): 0.4 mg via SUBLINGUAL
  Filled 2011-10-16: qty 25

## 2011-10-16 MED ORDER — IBUPROFEN 600 MG PO TABS: 600.0000 mg | ORAL_TABLET | Freq: Four times a day (QID) | ORAL | Status: DC | PRN

## 2011-10-16 MED ORDER — LIDOCAINE 5 % EX PTCH: 1.0000 | MEDICATED_PATCH | Freq: Once | CUTANEOUS | Status: DC

## 2011-10-16 MED ORDER — ASPIRIN 81 MG PO CHEW
324.0000 mg | CHEWABLE_TABLET | Freq: Once | ORAL | Status: AC
  Administered 2011-10-16: 324 mg via ORAL
  Filled 2011-10-16: qty 1

## 2011-10-16 NOTE — ED Provider Notes (Signed)
History     CSN: [id number]  Arrival date & time 10/16/11  0120   First MD Initiated Contact with Patient 10/16/11 0154      Chief Complaint  Patient presents with  . Chest Pain    (Consider location/radiation/quality/duration/timing/severity/associated sxs/prior treatment) HPI Comments: Patient here with left anterior chest wall pain worse with movement, palpation and deep inspiration - was just admitted to [facility] in May and then transferred here where he had a completely negative cardiac catheterization on 10/02/2011.  He states that this pain started today, denies fever, chills, nausea, vomiting, reports shortness of breath with this and that the pain is worse with movement.  Denies any cough, congestion, recent travel, back or neck pain.  Patient is a 31 y.o. male presenting with chest pain. The history is provided by the patient. No language interpreter was used.  Chest Pain The chest pain began 12 - 24 hours ago. Chest pain occurs constantly. The chest pain is unchanged. The pain is associated with breathing. At its most intense, the pain is at 10/10. The pain is currently at 10/10. The quality of the pain is described as pleuritic and sharp. The pain does not radiate. Chest pain is worsened by certain positions and deep breathing. Primary symptoms include shortness of breath. Pertinent negatives for primary symptoms include no fever, no fatigue, no syncope, no cough, no wheezing, no palpitations, no abdominal pain, no nausea, no vomiting, no dizziness and no altered mental status.  Pertinent negatives for associated symptoms include no claudication, no diaphoresis, no lower extremity edema, no near-syncope, no numbness, no orthopnea, no paroxysmal nocturnal dyspnea and no weakness. He tried nothing for the symptoms. Risk factors include no known risk factors.  His past medical history is significant for hypertension.  Procedure history is positive for cardiac catheterization. Procedure  history comments: cardiac cath early this month here - negative.     Past Medical History  Diagnosis Date  . Diverticulosis   . Hypertension   . Seizures   . Asthma   . Acid reflux   . Migraines   . Depression   . Mood swings   . Chest pain with normal coronary angiography     a.  09/2011 Cath:  Normal Cors, NL LV fxn  . Sleep apnea     a. On CPAP  . Kidney stones     Past Surgical History  Procedure Date  . Hernia repair   . Cholecystectomy   . Tonsillectomy   . Neck surgery   . Wrist fusion left  . Cardiac catheterization     No family history on file.  History  Substance Use Topics  . Smoking status: Former Games developer  . Smokeless tobacco: Never Used  . Alcohol Use: No      Review of Systems  Constitutional: Negative for fever, diaphoresis and fatigue.  Respiratory: Positive for shortness of breath. Negative for cough and wheezing.   Cardiovascular: Positive for chest pain. Negative for palpitations, orthopnea, claudication, syncope and near-syncope.  Gastrointestinal: Negative for nausea, vomiting and abdominal pain.  Neurological: Negative for dizziness, weakness and numbness.  Psychiatric/Behavioral: Negative for altered mental status.  All other systems reviewed and are negative.    Allergies  Bee venom; Meloxicam; Penicillins; Sulfa antibiotics; Ketorolac tromethamine; and Lortab  Home Medications   Current Outpatient Rx  Name Route Sig Dispense Refill  . ALBUTEROL SULFATE HFA 108 (90 BASE) MCG/ACT IN AERS Inhalation Inhale 2 puffs into the lungs every 6 (six) hours as  needed. For shortness of breath    . EPINEPHRINE 0.15 MG/0.3ML IJ DEVI Intramuscular Inject 0.15 mg into the muscle as needed.     Marland Kitchen LAMOTRIGINE 100 MG PO TABS Oral Take 200 mg by mouth 2 (two) times daily.     Marland Kitchen LISINOPRIL-HYDROCHLOROTHIAZIDE 20-12.5 MG PO TABS Oral Take 1 tablet by mouth daily.      Marland Kitchen LORATADINE 10 MG PO TABS Oral Take 10 mg by mouth daily.      .  MULTI-VITAMIN/MINERALS PO TABS Oral Take 1 tablet by mouth 2 (two) times daily.     Marland Kitchen OMEPRAZOLE 20 MG PO CPDR Oral Take 20 mg by mouth daily.    Marland Kitchen RANITIDINE HCL 150 MG PO TABS Oral Take 150 mg by mouth 2 (two) times daily.    . SERTRALINE HCL 100 MG PO TABS Oral Take 200 mg by mouth daily.      . SUCRALFATE 1 G PO TABS Oral Take 1 g by mouth 2 (two) times daily.      . SUMATRIPTAN SUCCINATE 100 MG PO TABS Oral Take 100 mg by mouth every 2 (two) hours as needed. For migraine      BP 109/63  Pulse 75  Temp(Src) 98.1 F (36.7 C) (Oral)  Resp 19  SpO2 96%  Physical Exam  Nursing note and vitals reviewed. Constitutional: He is oriented to person, place, and time. He appears well-developed and well-nourished. No distress.  HENT:  Head: Normocephalic and atraumatic.  Right Ear: External ear normal.  Left Ear: External ear normal.  Nose: Nose normal.  Mouth/Throat: Oropharynx is clear and moist. No oropharyngeal exudate.  Eyes: Conjunctivae are normal. Pupils are equal, round, and reactive to light. No scleral icterus.  Neck: Normal range of motion. Neck supple.  Cardiovascular: Normal rate, regular rhythm and normal heart sounds.  Exam reveals no gallop and no friction rub.   No murmur heard. Pulmonary/Chest: Effort normal and breath sounds normal. No respiratory distress. He has no wheezes. He has no rales. He exhibits tenderness. He exhibits no mass, no crepitus, no deformity and no swelling.    Abdominal: Soft. Bowel sounds are normal. He exhibits no distension and no mass. There is no tenderness. There is no rebound and no guarding.  Musculoskeletal: Normal range of motion. He exhibits no edema and no tenderness.       No calf tenderness - extremities warm to touch  Lymphadenopathy:    He has no cervical adenopathy.  Neurological: He is alert and oriented to person, place, and time. No cranial nerve deficit.  Skin: Skin is warm and dry. No rash noted. No erythema. No pallor.    Psychiatric: He has a normal mood and affect. His behavior is normal. Judgment and thought content normal.    ED Course  Procedures (including critical care time)  Labs Reviewed  CBC - Abnormal; Notable for the following:    MCHC 36.3 (*)    All other components within normal limits  DIFFERENTIAL  BASIC METABOLIC PANEL  POCT I-STAT TROPONIN I   Dg Chest 2 View  10/16/2011  *RADIOLOGY REPORT*  Clinical Data: Chest pain, lightheadedness, and nausea.  CHEST - 2 VIEW  Comparison: 05/14/2013[facility].  Findings: Mild cardiac enlargement with normal pulmonary vascularity.  No focal airspace consolidation in the lungs.  No blunting of costophrenic angles.  No pneumothorax.  Surgical clips in the right upper quadrant.  No significant change since previous study.  IMPRESSION: Mild cardiac enlargement.  No  pulmonary vascular congestion, edema, or consolidation.  Original Report Authenticated By: Marlon Pel, M.D.   Results for orders placed during the hospital encounter of 10/16/11  CBC      Component Value Range   WBC 10.3  4.0 - 10.5 (K/uL)   RBC 4.89  4.22 - 5.81 (MIL/uL)   Hemoglobin 14.8  13.0 - 17.0 (g/dL)   HCT 16.1  09.6 - 04.5 (%)   MCV 83.4  78.0 - 100.0 (fL)   MCH 30.3  26.0 - 34.0 (pg)   MCHC 36.3 (*) 30.0 - 36.0 (g/dL)   RDW 40.9  81.1 - 91.4 (%)   Platelets 218  150 - 400 (K/uL)  DIFFERENTIAL      Component Value Range   Neutrophils Relative 57  43 - 77 (%)   Neutro Abs 5.9  1.7 - 7.7 (K/uL)   Lymphocytes Relative 31  12 - 46 (%)   Lymphs Abs 3.2  0.7 - 4.0 (K/uL)   Monocytes Relative 8  3 - 12 (%)   Monocytes Absolute 0.9  0.1 - 1.0 (K/uL)   Eosinophils Relative 3  0 - 5 (%)   Eosinophils Absolute 0.4  0.0 - 0.7 (K/uL)   Basophils Relative 0  0 - 1 (%)   Basophils Absolute 0.0  0.0 - 0.1 (K/uL)  BASIC METABOLIC PANEL      Component Value Range   Sodium 137  135 - 145 (mEq/L)   Potassium 3.9  3.5 - 5.1 (mEq/L)   Chloride 101  96 - 112  (mEq/L)   CO2 24  19 - 32 (mEq/L)   Glucose, Bld 86  70 - 99 (mg/dL)   BUN 13  6 - 23 (mg/dL)   Creatinine, Ser 7.82  0.50 - 1.35 (mg/dL)   Calcium 95.6  8.4 - 10.5 (mg/dL)   GFR calc non Af Amer >90  >90 (mL/min)   GFR calc Af Amer >90  >90 (mL/min)  POCT I-STAT TROPONIN I      Component Value Range   Troponin i, poc 0.00  0.00 - 0.08 (ng/mL)   Comment 3            Ct Abdomen Pelvis Wo Contrast  10/08/2011  *RADIOLOGY REPORT*  Clinical Data: Right flank pain and dysuria.  CT ABDOMEN AND PELVIS WITHOUT CONTRAST  Technique:  Multidetector CT imaging of the abdomen and pelvis was performed following the standard protocol without intravenous contrast.  Comparison: CT of the abdomen and pelvis performed 09/22/2011  Findings: The visualized lung bases are clear.  The liver and spleen are unremarkable in appearance.  The patient is status post cholecystectomy, with clips noted at the gallbladder fossa.  The pancreas and adrenal glands are unremarkable.  The kidneys are unremarkable in appearance.  There is no evidence of hydronephrosis.  No renal or ureteral stones are seen.  No perinephric stranding is appreciated.  No free fluid is identified.  The small bowel is unremarkable in appearance.  The stomach is within normal limits.  No acute vascular abnormalities are seen.  The appendix is normal in caliber, without evidence for appendicitis.  Minimal diverticulosis is noted along the proximal sigmoid colon; the colon is otherwise unremarkable in appearance.  The bladder is mildly distended and grossly unremarkable.  The prostate remains normal in size.  No inguinal lymphadenopathy is seen.  No acute osseous abnormalities are identified.  IMPRESSION:  1.  No acute abnormalities seen within the abdomen or pelvis. 2.  Minimal diverticulosis  along the proximal sigmoid colon; no evidence of diverticulitis.  Original Report Authenticated By: Tonia Ghent, M.D.   Dg Chest 2 View  10/16/2011  *RADIOLOGY REPORT*   Clinical Data: Chest pain, lightheadedness, and nausea.  CHEST - 2 VIEW  Comparison: 05/14/2013from [facility].  Findings: Mild cardiac enlargement with normal pulmonary vascularity.  No focal airspace consolidation in the lungs.  No blunting of costophrenic angles.  No pneumothorax.  Surgical clips in the right upper quadrant.  No significant change since previous study.  IMPRESSION: Mild cardiac enlargement.  No pulmonary vascular congestion, edema, or consolidation.  Original Report Authenticated By: Marlon Pel, M.D.     Date: 10/16/2011  Rate: 91  Rhythm: normal sinus rhythm  QRS Axis: normal  Intervals: normal  ST/T Wave abnormalities: nonspecific ST changes  Conduction Disutrbances:none  Narrative Interpretation: Reviewed by Dr. Norlene Campbell  Old EKG Reviewed: unchanged     Catheterization  Indication: Chest Pain  Procedure: After informed consent and clinical "time out" the right groin was prepped and draped in a sterile fashion. A 5Fr sheath was placed in the right femoral artery using seldinger technique and local lidocaine. Standard JL4, JR4 and angled pigtail catheters were used to engage the coronary arteries. Coronary arteries were visualized in orthogonal views using caudal and cranial angulation. RAO ventriculography was done using 22* cc of contrast.  Medications:  Versed: 2 mg's Fentanyl: 25 ug's  Coronary Arteries:  Right dominant with no anomalies  LM: Normal   LAD: Normal  IM: Normal  D1: Normal  Circumflex: Normal  OM1: Normal  OM2: Normal  RCA: The RCA was very diificult to inject without catheter tip spasm or damping in a large RV branch. We tried multipurpos, AR1, JR4 and JR3.5 catheters.  With the latter catheter we were able to get a diagnostic injection of the RCA with no spasm or damping and it appeared normal  PDA: normal  PLA: Normal  Ventriculography: EF: 55 %, no RWMA's  Hemodynamics:  Aortic Pressure: 117 77 mmHg LV Pressure:  115 16 mmHg  Impression: No significant CAD. Catheter tip spasm of RCA. Pain non cardiac. Patient indicated 8/10 pain during case despite normal cors and LV  Ascending aortic root normal on ventriculography. D/C in a.m. If groin stable  Charlton Haws  10/02/2011  3:00 PM   Chest wall pain    MDM  Patient is 30 year old male with a history of HTN who underwent a recent negative cardiac cath and presents tonight with chest wall pain.  His enzymes here are negative, EKG is re-assuring and there is no indication for PE.  The pain is completely reproducible with palpation to the chest wall.  Given these findings, I believe this to be MSK chest pain and doubt cardiac in nature.  He was recently seen at [facility] on the 8th complaining of flank pain without any noted etiology.  Upon discussing the results with the patient he asked if I was going to give him something for pain in his IV, "which usually helps", I do not find any indication for narcotic pain medication at this time and will place him on lidocaine patch and ibuprofen.        Izola Price [place], [place] 10/16/11 [phone]

## 2011-10-16 NOTE — Discharge Instructions (Signed)
Chest Pain (Nonspecific) It is often hard to give a specific diagnosis for the cause of chest pain. There is always a chance that your pain could be related to something serious, such as a heart attack or a blood clot in the lungs. You need to follow up with your caregiver for further evaluation. CAUSES   Heartburn.   Pneumonia or bronchitis.   Anxiety or stress.   Inflammation around your heart (pericarditis) or lung (pleuritis or pleurisy).   A blood clot in the lung.   A collapsed lung (pneumothorax). It can develop suddenly on its own (spontaneous pneumothorax) or from injury (trauma) to the chest.   Shingles infection (herpes zoster virus).  The chest wall is composed of bones, muscles, and cartilage. Any of these can be the source of the pain.  The bones can be bruised by injury.   The muscles or cartilage can be strained by coughing or overwork.   The cartilage can be affected by inflammation and become sore (costochondritis).  DIAGNOSIS  Lab tests or other studies, such as X-rays, electrocardiography, stress testing, or cardiac imaging, may be needed to find the cause of your pain.  TREATMENT   Treatment depends on what may be causing your chest pain. Treatment may include:   Acid blockers for heartburn.   Anti-inflammatory medicine.   Pain medicine for inflammatory conditions.   Antibiotics if an infection is present.   You may be advised to change lifestyle habits. This includes stopping smoking and avoiding alcohol, caffeine, and chocolate.   You may be advised to keep your head raised (elevated) when sleeping. This reduces the chance of acid going backward from your stomach into your esophagus.   Most of the time, nonspecific chest pain will improve within 2 to 3 days with rest and mild pain medicine.  HOME CARE INSTRUCTIONS   If antibiotics were prescribed, take your antibiotics as directed. Finish them even if you start to feel better.   For the next few  days, avoid physical activities that bring on chest pain. Continue physical activities as directed.   Do not smoke.   Avoid drinking alcohol.   Only take over-the-counter or prescription medicine for pain, discomfort, or fever as directed by your caregiver.   Follow your caregiver's suggestions for further testing if your chest pain does not go away.   Keep any follow-up appointments you made. If you do not go to an appointment, you could develop lasting (chronic) problems with pain. If there is any problem keeping an appointment, you must call to reschedule.  SEEK MEDICAL CARE IF:   You think you are having problems from the medicine you are taking. Read your medicine instructions carefully.   Your chest pain does not go away, even after treatment.   You develop a rash with blisters on your chest.  SEEK IMMEDIATE MEDICAL CARE IF:   You have increased chest pain or pain that spreads to your arm, neck, jaw, back, or abdomen.   You develop shortness of breath, an increasing cough, or you are coughing up blood.   You have severe back or abdominal pain, feel nauseous, or vomit.   You develop severe weakness, fainting, or chills.   You have a fever.  THIS IS AN EMERGENCY. Do not wait to see if the pain will go away. Get medical help at once. Call your local emergency services (911 in [place] Do not drive yourself to the hospital. MAKE SURE YOU:   Understand these instructions.     Will watch your condition.   Will get help right away if you are not doing well or get worse.  Document Released: 02/25/2005 Document Revised: 05/07/2011 Document Reviewed: 12/22/2007 [employer] Patient Information 2012 [employer]. 

## 2011-10-16 NOTE — ED Notes (Signed)
Pt denies any further questions upon discharge. 

## 2011-10-16 NOTE — ED Notes (Signed)
PT. REPORTS MID CHEST PAIN WITH SLIGHT SOB ,DIZZINESS AND LIGHTHEADED. PAIN WORSE WITH DEEP INSPIRATION . PT. STATES CARDIAC CATH 2 WEEKS AGO .

## 2011-10-16 NOTE — ED Notes (Signed)
Pt reports no change in chest pain after nitro SL x 3, MD will be made aware and will continue to monitor pt.

## 2011-10-16 NOTE — ED Provider Notes (Signed)
Medical screening examination/treatment/procedure(s) were performed by non-physician practitioner and as supervising physician I was immediately available for consultation/collaboration.  Tyjai Matuszak M Lenette Rau, MD 10/16/11 0648 

## 2011-10-19 DIAGNOSIS — R079 Chest pain, unspecified: Secondary | ICD-10-CM

## 2011-10-26 ENCOUNTER — Emergency Department (HOSPITAL_COMMUNITY): Payer: Medicaid Other

## 2011-10-26 ENCOUNTER — Encounter (HOSPITAL_COMMUNITY): Payer: Self-pay | Admitting: Emergency Medicine

## 2011-10-26 ENCOUNTER — Emergency Department (HOSPITAL_COMMUNITY)
Admission: EM | Admit: 2011-10-26 | Discharge: 2011-10-26 | Disposition: A | Discharge status: Home / Self Care | Payer: Medicaid Other | Attending: Emergency Medicine | Admitting: Emergency Medicine

## 2011-10-26 DIAGNOSIS — R109 Unspecified abdominal pain: Secondary | ICD-10-CM | POA: Insufficient documentation

## 2011-10-26 DIAGNOSIS — J45909 Unspecified asthma, uncomplicated: Secondary | ICD-10-CM | POA: Insufficient documentation

## 2011-10-26 DIAGNOSIS — R569 Unspecified convulsions: Secondary | ICD-10-CM | POA: Insufficient documentation

## 2011-10-26 DIAGNOSIS — I10 Essential (primary) hypertension: Secondary | ICD-10-CM | POA: Insufficient documentation

## 2011-10-26 DIAGNOSIS — F329 Major depressive disorder, single episode, unspecified: Secondary | ICD-10-CM | POA: Insufficient documentation

## 2011-10-26 DIAGNOSIS — R112 Nausea with vomiting, unspecified: Secondary | ICD-10-CM | POA: Insufficient documentation

## 2011-10-26 DIAGNOSIS — G4730 Sleep apnea, unspecified: Secondary | ICD-10-CM | POA: Insufficient documentation

## 2011-10-26 DIAGNOSIS — Z87891 Personal history of nicotine dependence: Secondary | ICD-10-CM | POA: Insufficient documentation

## 2011-10-26 DIAGNOSIS — F3289 Other specified depressive episodes: Secondary | ICD-10-CM | POA: Insufficient documentation

## 2011-10-26 DIAGNOSIS — R10819 Abdominal tenderness, unspecified site: Secondary | ICD-10-CM | POA: Insufficient documentation

## 2011-10-26 DIAGNOSIS — K219 Gastro-esophageal reflux disease without esophagitis: Secondary | ICD-10-CM | POA: Insufficient documentation

## 2011-10-26 LAB — DIFFERENTIAL
Basophils Absolute: 0 10*3/uL (ref 0.0–0.1)
Eosinophils Absolute: 0.5 10*3/uL (ref 0.0–0.7)
Eosinophils Relative: 6 % — ABNORMAL HIGH (ref 0–5)
Lymphocytes Relative: 36 % (ref 12–46)
Neutrophils Relative %: 50 % (ref 43–77)

## 2011-10-26 LAB — URINALYSIS, ROUTINE W REFLEX MICROSCOPIC
Bilirubin Urine: NEGATIVE
Ketones, ur: NEGATIVE mg/dL
Nitrite: NEGATIVE
Urobilinogen, UA: 0.2 mg/dL (ref 0.0–1.0)

## 2011-10-26 LAB — COMPREHENSIVE METABOLIC PANEL
Albumin: 4.3 g/dL (ref 3.5–5.2)
Alkaline Phosphatase: 77 U/L (ref 39–117)
BUN: 14 mg/dL (ref 6–23)
CO2: 25 mEq/L (ref 19–32)
Chloride: 100 mEq/L (ref 96–112)
GFR calc non Af Amer: >90 mL/min (ref >90)
Potassium: 3.8 mEq/L (ref 3.5–5.1)
Total Bilirubin: 0.3 mg/dL (ref 0.3–1.2)

## 2011-10-26 LAB — CBC
MCH: 29.3 pg (ref 26.0–34.0)
MCV: 84.6 fL (ref 78.0–100.0)
Platelets: 207 10*3/uL (ref 150–400)
RDW: 12.7 % (ref 11.5–15.5)
WBC: 7.5 10*3/uL (ref 4.0–10.5)

## 2011-10-26 LAB — LIPASE, BLOOD: Lipase: 32 U/L (ref 11–59)

## 2011-10-26 MED ORDER — IOHEXOL 300 MG/ML  SOLN
100.0000 mL | Freq: Once | INTRAMUSCULAR | Status: AC | PRN
  Administered 2011-10-26: 100 mL via INTRAVENOUS

## 2011-10-26 MED ORDER — OXYCODONE-ACETAMINOPHEN 5-325 MG PO TABS: 1.0000 | ORAL_TABLET | ORAL | Status: AC | PRN

## 2011-10-26 MED ORDER — HYDROMORPHONE HCL PF 1 MG/ML IJ SOLN
1.0000 mg | Freq: Once | INTRAMUSCULAR | Status: AC
  Administered 2011-10-26: 1 mg via INTRAVENOUS
  Filled 2011-10-26: qty 1

## 2011-10-26 MED ORDER — ONDANSETRON HCL 4 MG PO TABS: 4.0000 mg | ORAL_TABLET | Freq: Three times a day (TID) | ORAL | Status: AC | PRN

## 2011-10-26 MED ORDER — PROMETHAZINE HCL 25 MG PO TABS: 25.0000 mg | ORAL_TABLET | Freq: Four times a day (QID) | ORAL | Status: AC | PRN

## 2011-10-26 MED ORDER — ONDANSETRON HCL 4 MG/2ML IJ SOLN
4.0000 mg | Freq: Once | INTRAMUSCULAR | Status: AC
  Administered 2011-10-26: 4 mg via INTRAVENOUS
  Filled 2011-10-26: qty 2

## 2011-10-26 NOTE — ED Notes (Signed)
Pt states that he has RLQ pain that starts on his R side and moves to that area. States it started two hours ago and is sharp. N/V.

## 2011-10-26 NOTE — ED Notes (Signed)
Pt alert, resp even unlabored, skin pwd, shows no s/s of distress/discomfort, pt cont to c/o gen abd pain, states "these meds are not enough", requesting another dose of pain medication, MD made aware, cont to monitor

## 2011-10-26 NOTE — ED Notes (Signed)
Pt returns from CT.

## 2011-10-26 NOTE — ED Provider Notes (Signed)
History     CSN: [id number]  Arrival date & time 10/26/11  0116   First MD Initiated Contact with Patient 10/26/11 [phone]      Chief Complaint  Patient presents with  . Abdominal Pain    (Consider location/radiation/quality/duration/timing/severity/associated sxs/prior treatment) HPI Comments: 31 y/o with history of cholecystitis status post surgery in the past who presents with acute onset of right-sided abdominal pain which started 4 hours prior to arrival. The pain has been constant, feels like it is a pushing pain, it is moderate to severe and associated with nausea and one episode of vomiting. He denies any hematuria, dysuria, fevers chills coughing shortness of breath or back pain. This is unlike pain with this kidney stone in the past and seems to be worse when he moves or pushes on his abdomen.  The history is provided by the patient and a relative.    Past Medical History  Diagnosis Date  . Diverticulosis   . Hypertension   . Seizures   . Asthma   . Acid reflux   . Migraines   . Depression   . Mood swings   . Chest pain with normal coronary angiography     a.  09/2011 Cath:  Normal Cors, NL LV fxn  . Sleep apnea     a. On CPAP  . Kidney stones     Past Surgical History  Procedure Date  . Hernia repair   . Cholecystectomy   . Tonsillectomy   . Neck surgery   . Wrist fusion left  . Cardiac catheterization     No family history on file.  History  Substance Use Topics  . Smoking status: Former Games developer  . Smokeless tobacco: Never Used  . Alcohol Use: No      Review of Systems  All other systems reviewed and are negative.    Allergies  Bee venom; Meloxicam; Penicillins; Sulfa antibiotics; Ketorolac tromethamine; and Lortab  Home Medications   Current Outpatient Rx  Name Route Sig Dispense Refill  . ALBUTEROL SULFATE HFA 108 (90 BASE) MCG/ACT IN AERS Inhalation Inhale 2 puffs into the lungs every 6 (six) hours as needed. For shortness of breath      . LAMOTRIGINE 100 MG PO TABS Oral Take 200 mg by mouth 2 (two) times daily.     Marland Kitchen LISINOPRIL-HYDROCHLOROTHIAZIDE 20-12.5 MG PO TABS Oral Take 1 tablet by mouth daily.      Marland Kitchen LORATADINE 10 MG PO TABS Oral Take 10 mg by mouth daily.      . MULTI-VITAMIN/MINERALS PO TABS Oral Take 1 tablet by mouth 2 (two) times daily.     Marland Kitchen OMEPRAZOLE 20 MG PO CPDR Oral Take 20 mg by mouth daily.    Marland Kitchen RANITIDINE HCL 150 MG PO TABS Oral Take 150 mg by mouth 2 (two) times daily.    . SERTRALINE HCL 100 MG PO TABS Oral Take 200 mg by mouth daily.      . SUCRALFATE 1 G PO TABS Oral Take 1 g by mouth 2 (two) times daily.      . SUMATRIPTAN SUCCINATE 100 MG PO TABS Oral Take 100 mg by mouth every 2 (two) hours as needed. For migraine    . EPINEPHRINE 0.15 MG/0.3ML IJ DEVI Intramuscular Inject 0.15 mg into the muscle as needed.     Marland Kitchen ONDANSETRON HCL 4 MG PO TABS Oral Take 1 tablet (4 mg total) by mouth every 8 (eight) hours as needed for nausea. 10 tablet 0  .  OXYCODONE-ACETAMINOPHEN 5-325 MG PO TABS Oral Take 1 tablet by mouth every 4 (four) hours as needed for pain. May take 2 tablets PO q 6 hours for severe pain - Do not take with Tylenol as this tablet already contains tylenol 15 tablet 0  . PROMETHAZINE HCL 25 MG PO TABS Oral Take 1 tablet (25 mg total) by mouth every 6 (six) hours as needed for nausea. 12 tablet 0    BP 102/58  Pulse 70  Temp(Src) 98.3 F (36.8 C) (Oral)  Resp 20  Wt 250 lb (113.399 kg)  SpO2 97%  Physical Exam  Nursing note and vitals reviewed. Constitutional: He appears well-developed and well-nourished. No distress.  HENT:  Head: Normocephalic and atraumatic.  Mouth/Throat: Oropharynx is clear and moist. No oropharyngeal exudate.  Eyes: Conjunctivae and EOM are normal. Pupils are equal, round, and reactive to light. Right eye exhibits no discharge. Left eye exhibits no discharge. No scleral icterus.  Neck: Normal range of motion. Neck supple. No JVD present. No thyromegaly present.   Cardiovascular: Normal rate, regular rhythm, normal heart sounds and intact distal pulses.  Exam reveals no gallop and no friction rub.   No murmur heard. Pulmonary/Chest: Effort normal and breath sounds normal. No respiratory distress. He has no wheezes. He has no rales.  Abdominal: Soft. Bowel sounds are normal. He exhibits no distension and no mass. There is tenderness ( Right lower midabdomen tenderness, no guarding, no masses, non-peritoneal. CVA tenderness present on the right but not the left.).  Musculoskeletal: Normal range of motion. He exhibits no edema and no tenderness.  Lymphadenopathy:    He has no cervical adenopathy.  Neurological: He is alert. Coordination normal.  Skin: Skin is warm and dry. No rash noted. No erythema.  Psychiatric: He has a normal mood and affect. His behavior is normal.    ED Course  Procedures (including critical care time)  Labs Reviewed  COMPREHENSIVE METABOLIC PANEL - Abnormal; Notable for the following:    AST 43 (*)    All other components within normal limits  DIFFERENTIAL - Abnormal; Notable for the following:    Eosinophils Relative 6 (*)    All other components within normal limits  CBC  LIPASE, BLOOD  URINALYSIS, ROUTINE W REFLEX MICROSCOPIC   Ct Abdomen Pelvis W Contrast  10/26/2011  *RADIOLOGY REPORT*  Clinical Data: Right lower quadrant pain.  CT ABDOMEN AND PELVIS WITH CONTRAST  Technique:  Multidetector CT imaging of the abdomen and pelvis was performed following the standard protocol during bolus administration of intravenous contrast.  Contrast: OMNIPAQUE IOHEXOL 300 MG/ML  SOLN  Comparison: 10/08/2011 at [facility].  Findings: The lung bases are clear.  Surgical absence of the gallbladder.  The liver, spleen, pancreas, adrenal glands, kidneys, abdominal aorta, and retroperitoneal lymph nodes are unremarkable.  The stomach and small bowel are decompressed.  Stool filled colon without distension or wall thickening.   Prominent visceral adipose tissues.  No free air or free fluid in the abdomen.  Pelvis:  The appendix is normal.  Scattered diverticula in the sigmoid colon without diverticulosis.  The prostate gland is not enlarged.  The bladder wall is not thickened.  No free or loculated pelvic fluid collections.  No pelvic lymphadenopathy.  Normal alignment of the lumbar vertebrae.  No significant changes since the previous study.  IMPRESSION: No acute process demonstrated in the abdomen or pelvis.  The appendix is normal.  Scattered diverticula in the sigmoid colon without diverticulitis.  Original Report  Authenticated By: Marlon Pel, M.D.     1. Abdominal pain       MDM  Vital signs are normal, no fever or tachycardia or hypotension, labs pending, urinalysis pending, will need CT scan to rule out appendicitis as the pain does get worse with palpation.  I have informed the patient of the indications for return, I have also informed him of the results of his CT scan and blood work. He states that he still has pain on the right side of his abdomen however he still continues to be soft and has no guarding. His white blood cell count is normal, a CT scan is normal and there is no evidence that this is an acute surgical problem. He has agreed to return to the hospital should his symptoms worsen.  Discharge Prescriptions include:  #1 Percocet  #2 Zofran  #3 Phenergan  Vida Roller, MD 10/26/11 [phone]

## 2011-10-26 NOTE — ED Notes (Signed)
Patient transported to CT 

## 2011-10-26 NOTE — ED Notes (Signed)
Pt alert, nad, arrives from home, c/o right lower quad pain, onset approx 4 hrs ago, described as "being hit with a bat", non radiating

## 2011-10-26 NOTE — Discharge Instructions (Signed)
You have been diagnosed with undifferentiated abdominal pain.  Abdominal pain can be caused by many things. Your caregiver evaluates the seriousness of your pain by an examination and possibly blood or urine tests and imaging (CT scan, x-rays, ultrasound). Many cases can be observed and treated at home after initial evaluation in the emergency department. Even though you are being discharged home, abdominal pain can be unpredictable. Therefore, you need a repeat exam if your pain does not resolve, returns, or worsens. Most patient's with abdominal pain do not need to be admitted to the hospital or have surgery, but serious problems like appendicitis and gallbladder attacks can start out as nonspecific pain. Many abdominal conditions cannot be diagnosed in 1 visit, so followup evaluations are very important.  Seek immediate medical attention if:  *The pain does not go away or becomes severe. *Temperature above 101 develops *Repeated vomiting occurs(multiple episodes) *The pain becomes localized to portions of the abdomen. The right side could possibly be appendicitis. In an adult, the left lower portion of the abdomen could be colitis or diverticulitis. *Blood is being passed in stools or vomit *Return also if you develop chest pain, difficulty breathing, dizziness or fainting, or become confused poorly responsive or inconsolable (young children).     If you do not have a physician, you should reference the below phone numbers and call in the morning to establish follow up care.  RESOURCE GUIDE  Dental Problems  Patients with Medicaid: [facility]                     [facility] [address]                                           [address] Phone:  [phone]                                                  Phone:  [phone]  If unable to pay or uninsured, contact:  [facility] or [facility] to become qualified for the adult dental  clinic.  Chronic Pain Problems Contact [facility]  [phone] Patients need to be referred by their primary care doctor.  Insufficient Money for Medicine Contact [employer]:  call "211" or [facility] 271-5999.  No Primary Care Doctor Call [facility]  832-8000 Other agencies that provide inexpensive medical care    [facility]  832-8035    [facility]  832-7272    [facility]  [phone]    Women's Clinic  832-4777    [facility]  [phone]    [facility]  [phone]  Psychological Services [facility]  [phone] [facility]  [phone] [facility]   800 853-5163 (emergency services 641-4993)  Substance Abuse Resources [facility]  336-882-2125 [facility] 336-784-9470 [facility] 336-285-9073 [facility] 336-845-3988 Residential & Outpatient Substance Abuse Program  800-659-3381  Abuse/Neglect [place] Child Abuse Hotline (336) 641-3795 [place] Child Abuse Hotline 800-378-5315 (After Hours)  Emergency Shelter [facility] (336) 271-5985  [facility] (336) 275-9566 [facility]   Services (704) 372-4663  MRSA Hotline #:   832-7006    [place] Resources  [facility] [place]     [employer]                          [facility] [address] [place]                       [address]      [address]  [place]                                                [place]                            [place] Phone:  [phone]                                   Phone:  [phone]                 Phone:  [phone]  [facility] Phone:  [phone]  [place] Child Abuse Hotline (336) 342-1394 (336) 342-3537 (After Hours)    

## 2011-11-02 ENCOUNTER — Telehealth: Payer: Self-pay | Admitting: *Deleted

## 2011-11-02 NOTE — Telephone Encounter (Signed)
Received call from [facility] saying patient was reporting having chest pain that was radiating to his left arm with tingling in arm. During time of symptom patients was sinus tachycardia 132-135. Reports given to PA. PA reviewed strips and said patient is to f/u with Myrtis Ser after completion of 21 day monitor. Patient informed via voicemail that he need to f/u in office after completion of 21 day monitor per Gene. Appt should be after June 7th 2013 with Dr. Myrtis Ser.

## 2011-11-15 ENCOUNTER — Emergency Department (HOSPITAL_COMMUNITY)
Admission: EM | Admit: 2011-11-15 | Discharge: 2011-11-15 | Disposition: A | Discharge status: Home / Self Care | Payer: Medicaid Other | Source: Home / Self Care

## 2011-11-15 ENCOUNTER — Encounter (HOSPITAL_COMMUNITY): Payer: Self-pay

## 2011-11-15 ENCOUNTER — Encounter (HOSPITAL_COMMUNITY): Payer: Self-pay | Admitting: Emergency Medicine

## 2011-11-15 ENCOUNTER — Emergency Department (HOSPITAL_COMMUNITY)
Admission: EM | Admit: 2011-11-15 | Discharge: 2011-11-16 | Disposition: A | Discharge status: Home / Self Care | Payer: Medicaid Other | Attending: Emergency Medicine | Admitting: Emergency Medicine

## 2011-11-15 DIAGNOSIS — I10 Essential (primary) hypertension: Secondary | ICD-10-CM | POA: Insufficient documentation

## 2011-11-15 DIAGNOSIS — Z79899 Other long term (current) drug therapy: Secondary | ICD-10-CM | POA: Insufficient documentation

## 2011-11-15 DIAGNOSIS — G4730 Sleep apnea, unspecified: Secondary | ICD-10-CM | POA: Insufficient documentation

## 2011-11-15 DIAGNOSIS — R109 Unspecified abdominal pain: Secondary | ICD-10-CM

## 2011-11-15 DIAGNOSIS — K219 Gastro-esophageal reflux disease without esophagitis: Secondary | ICD-10-CM | POA: Insufficient documentation

## 2011-11-15 DIAGNOSIS — Z87891 Personal history of nicotine dependence: Secondary | ICD-10-CM | POA: Insufficient documentation

## 2011-11-15 LAB — CBC
MCH: 29.2 pg (ref 26.0–34.0)
MCHC: 34.5 g/dL (ref 30.0–36.0)
MCV: 84.6 fL (ref 78.0–100.0)
Platelets: 194 10*3/uL (ref 150–400)
RDW: 12.9 % (ref 11.5–15.5)

## 2011-11-15 LAB — DIFFERENTIAL
Basophils Absolute: 0 10*3/uL (ref 0.0–0.1)
Eosinophils Absolute: 0.6 10*3/uL (ref 0.0–0.7)
Eosinophils Relative: 8 % — ABNORMAL HIGH (ref 0–5)
Lymphs Abs: 2.6 10*3/uL (ref 0.7–4.0)

## 2011-11-15 LAB — BASIC METABOLIC PANEL
Calcium: 10.3 mg/dL (ref 8.4–10.5)
GFR calc non Af Amer: >90 mL/min (ref >90)
Glucose, Bld: 83 mg/dL (ref 70–99)
Sodium: 135 mEq/L (ref 135–145)

## 2011-11-15 NOTE — ED Notes (Signed)
Unable to locate pt.  Called his cell phone number and he states he left due to the wait.  States he is already back in [place].  Pt encouraged to return if needed.

## 2011-11-15 NOTE — ED Notes (Signed)
Abdominal pain and vomiting bright red blood per pt. Started today per pt.

## 2011-11-15 NOTE — ED Notes (Signed)
Unable to locate pt in waiting area 

## 2011-11-15 NOTE — ED Notes (Signed)
C/o R sided abd pain, nausea, and vomiting x 2 days.

## 2011-11-16 ENCOUNTER — Emergency Department (HOSPITAL_COMMUNITY): Payer: Medicaid Other

## 2011-11-16 LAB — URINALYSIS, ROUTINE W REFLEX MICROSCOPIC
Bilirubin Urine: NEGATIVE
Hgb urine dipstick: NEGATIVE
Ketones, ur: NEGATIVE mg/dL
Protein, ur: NEGATIVE mg/dL
Urobilinogen, UA: 0.2 mg/dL (ref 0.0–1.0)

## 2011-11-16 MED ORDER — ONDANSETRON HCL 4 MG/2ML IJ SOLN
4.0000 mg | Freq: Once | INTRAMUSCULAR | Status: AC
  Administered 2011-11-16: 4 mg via INTRAVENOUS
  Filled 2011-11-16: qty 2

## 2011-11-16 MED ORDER — HYDROMORPHONE HCL PF 1 MG/ML IJ SOLN
1.0000 mg | Freq: Once | INTRAMUSCULAR | Status: AC
  Administered 2011-11-16: 1 mg via INTRAVENOUS
  Filled 2011-11-16: qty 1

## 2011-11-16 NOTE — ED Notes (Signed)
Pt alert & oriented x4, stable gait. Pt given discharge instructions, paperwork. Patient instructed to stop at the registration desk to finish any additional paperwork. pt verbalized understanding. Pt left department w/ no further questions.  

## 2011-11-16 NOTE — ED Notes (Signed)
Pt requesting pain medication at this time. EDP notified. 

## 2011-11-16 NOTE — Discharge Instructions (Signed)
Your blood work was normal here today. Your xray shows a lot of stool and gas in the colon. Use MIRALAX twice a day for the next few days to ensure you have several bowel movements and get cleaned out. Follow up with your doctor.

## 2011-11-17 NOTE — ED Provider Notes (Signed)
History     CSN: [id number]  Arrival date & time 11/15/11  2303   First MD Initiated Contact with Patient 11/16/11 0126      Chief Complaint  Patient presents with  . Abdominal Pain  . Hematemesis    (Consider location/radiation/quality/duration/timing/severity/associated sxs/prior treatment) HPI Chris Spencer is a 31 y.o. male who presents to the Emergency Department complaining of right sided abdominal pain that has been present for two days associated with , nausea, vomiting and bloody emesis. Denies fever, chills, shortness of breath, diarrhea.  Past Medical History  Diagnosis Date  . Diverticulosis   . Hypertension   . Seizures   . Asthma   . Acid reflux   . Migraines   . Depression   . Mood swings   . Chest pain with normal coronary angiography     a.  09/2011 Cath:  Normal Cors, NL LV fxn  . Sleep apnea     a. On CPAP  . Kidney stones     Past Surgical History  Procedure Date  . Hernia repair   . Cholecystectomy   . Tonsillectomy   . Neck surgery   . Wrist fusion left  . Cardiac catheterization     No family history on file.  History  Substance Use Topics  . Smoking status: Former Games developer  . Smokeless tobacco: Never Used  . Alcohol Use: No      Review of Systems  Constitutional: Negative for fever.       10 Systems reviewed and are negative for acute change except as noted in the HPI.  HENT: Negative for congestion.   Eyes: Negative for discharge and redness.  Respiratory: Negative for cough and shortness of breath.   Cardiovascular: Negative for chest pain.  Gastrointestinal: Positive for nausea, vomiting and abdominal pain.  Musculoskeletal: Negative for back pain.  Skin: Negative for rash.  Neurological: Negative for syncope, numbness and headaches.  Psychiatric/Behavioral:       No behavior change.    Allergies  Bee venom; Meloxicam; Penicillins; Sulfa antibiotics; Ketorolac tromethamine; and Lortab  Home Medications   Current  Outpatient Rx  Name Route Sig Dispense Refill  . ALBUTEROL SULFATE HFA 108 (90 BASE) MCG/ACT IN AERS Inhalation Inhale 2 puffs into the lungs every 6 (six) hours as needed. For shortness of breath    . EPINEPHRINE 0.15 MG/0.3ML IJ DEVI Intramuscular Inject 0.15 mg into the muscle as needed.     Marland Kitchen LAMOTRIGINE 100 MG PO TABS Oral Take 200 mg by mouth 2 (two) times daily.     Marland Kitchen LISINOPRIL-HYDROCHLOROTHIAZIDE 20-12.5 MG PO TABS Oral Take 1 tablet by mouth daily.      Marland Kitchen LORATADINE 10 MG PO TABS Oral Take 10 mg by mouth daily.      . MULTI-VITAMIN/MINERALS PO TABS Oral Take 1 tablet by mouth 2 (two) times daily.     Marland Kitchen OMEPRAZOLE 20 MG PO CPDR Oral Take 20 mg by mouth daily.    Marland Kitchen RANITIDINE HCL 150 MG PO TABS Oral Take 150 mg by mouth 2 (two) times daily.    . SERTRALINE HCL 100 MG PO TABS Oral Take 200 mg by mouth daily.      . SUCRALFATE 1 G PO TABS Oral Take 1 g by mouth 2 (two) times daily.      . SUMATRIPTAN SUCCINATE 100 MG PO TABS Oral Take 100 mg by mouth every 2 (two) hours as needed. For migraine      BP 127/80  Pulse 87  Temp 97.6 F (36.4 C) (Oral)  Resp 20  Ht 5\' 9"  (1.753 m)  Wt 245 lb (111.131 kg)  BMI 36.18 kg/m2  SpO2 97%  Physical Exam  Nursing note and vitals reviewed. Constitutional:       Awake, alert, nontoxic appearance.  HENT:  Head: Normocephalic and atraumatic.  Right Ear: External ear normal.  Left Ear: External ear normal.  Eyes: Right eye exhibits no discharge. Left eye exhibits no discharge.  Neck: Normal range of motion. Neck supple.  Cardiovascular: Normal rate and normal heart sounds.   Pulmonary/Chest: Effort normal and breath sounds normal. He has no wheezes. He exhibits no tenderness.  Abdominal: Soft. There is no tenderness. There is no rebound.  Musculoskeletal: He exhibits no tenderness.       Baseline ROM, no obvious new focal weakness.  Neurological:       Mental status and motor strength appears baseline for patient and situation.  Skin: No  rash noted.  Psychiatric:       He is not making good decisions and will not talk with me.    ED Course  Procedures (including critical care time) Results for orders placed during the hospital encounter of 11/15/11  URINALYSIS, ROUTINE W REFLEX MICROSCOPIC      Component Value Range   Color, Urine YELLOW  YELLOW   APPearance CLEAR  CLEAR   Specific Gravity, Urine 1.010  1.005 - 1.030   pH 6.0  5.0 - 8.0   Glucose, UA NEGATIVE  NEGATIVE mg/dL   Hgb urine dipstick NEGATIVE  NEGATIVE   Bilirubin Urine NEGATIVE  NEGATIVE   Ketones, ur NEGATIVE  NEGATIVE mg/dL   Protein, ur NEGATIVE  NEGATIVE mg/dL   Urobilinogen, UA 0.2  0.0 - 1.0 mg/dL   Nitrite NEGATIVE  NEGATIVE   Leukocytes, UA NEGATIVE  NEGATIVE  CBC      Component Value Range   WBC 8.0  4.0 - 10.5 K/uL   RBC 4.73  4.22 - 5.81 MIL/uL   Hemoglobin 13.8  13.0 - 17.0 g/dL   HCT 16.1  09.6 - 04.5 %   MCV 84.6  78.0 - 100.0 fL   MCH 29.2  26.0 - 34.0 pg   MCHC 34.5  30.0 - 36.0 g/dL   RDW 40.9  81.1 - 91.4 %   Platelets 194  150 - 400 K/uL  DIFFERENTIAL      Component Value Range   Neutrophils Relative 52  43 - 77 %   Neutro Abs 4.2  1.7 - 7.7 K/uL   Lymphocytes Relative 32  12 - 46 %   Lymphs Abs 2.6  0.7 - 4.0 K/uL   Monocytes Relative 7  3 - 12 %   Monocytes Absolute 0.6  0.1 - 1.0 K/uL   Eosinophils Relative 8 (*) 0 - 5 %   Eosinophils Absolute 0.6  0.0 - 0.7 K/uL   Basophils Relative 0  0 - 1 %   Basophils Absolute 0.0  0.0 - 0.1 K/uL  BASIC METABOLIC PANEL      Component Value Range   Sodium 135  135 - 145 mEq/L   Potassium 3.6  3.5 - 5.1 mEq/L   Chloride 100  96 - 112 mEq/L   CO2 25  19 - 32 mEq/L   Glucose, Bld 83  70 - 99 mg/dL   BUN 9  6 - 23 mg/dL   Creatinine, Ser 7.82  0.50 - 1.35 mg/dL   Calcium 10.3  8.4 - 10.5 mg/dL   GFR calc non Af Amer >90  >90 mL/min   GFR calc Af Amer >90  >90 mL/min    Dg Abd 2 Views  11/16/2011  *RADIOLOGY REPORT*  Clinical Data: Right-sided abdominal pain for 1 day;  hematemesis.  ABDOMEN - 2 VIEW  Comparison: Abdominal radiograph performed 06/15/2011, and CT of the abdomen and pelvis performed 10/26/2011  Findings: The visualized bowel gas pattern is unremarkable. Scattered air and stool filled loops of colon are seen; no abnormal dilatation of small bowel loops is seen to suggest small bowel obstruction.  No free intra-abdominal air is identified on the provided upright view.  Clips are noted within the right upper quadrant, reflecting prior cholecystectomy.  The visualized osseous structures are within normal limits; the sacroiliac joints are unremarkable in appearance.  The visualized lung bases are essentially clear.  IMPRESSION: Unremarkable bowel gas pattern; no free intra-abdominal air seen.  Original Report Authenticated By: Tonia Ghent, M.D.   Gwendoline.Kiang Spoke with radiologist Dr. Cherly Hensen. Review of recordes show the patient has had three CT scans in the last 6 weeks. Discussed with patient the number of CTs performed and not needing CT here tonight. The most recent CT done 10/26/2011 showed no acute processes.   1. Abdominal pain       MDM  Patient with recurrent abdominal pain. He has been evaluated with labs and CT of the abdomen as recently as 10/26/2011. Labs and acute abdominal series are unremarkable. Patient was given analgesic and antiemetic with relief. Pt stable in ED with no significant deterioration in condition.The patient appears reasonably screened and/or stabilized for discharge and I doubt any other medical condition or other [facility] requiring further screening, evaluation, or treatment in the ED at this time prior to discharge.  MDM Reviewed: nursing note, vitals and previous chart Reviewed previous: x-ray Interpretation: labs and CT scan           Nicoletta Dress. Colon Branch, MD 11/17/11 [phone]

## 2011-12-16 ENCOUNTER — Emergency Department (HOSPITAL_COMMUNITY)
Admission: EM | Admit: 2011-12-16 | Discharge: 2011-12-16 | Disposition: A | Discharge status: Home / Self Care | Payer: Medicaid Other | Attending: Emergency Medicine | Admitting: Emergency Medicine

## 2011-12-16 ENCOUNTER — Encounter (HOSPITAL_COMMUNITY): Payer: Self-pay

## 2011-12-16 ENCOUNTER — Emergency Department (HOSPITAL_COMMUNITY): Payer: Medicaid Other

## 2011-12-16 ENCOUNTER — Emergency Department (HOSPITAL_COMMUNITY)
Admission: EM | Admit: 2011-12-16 | Discharge: 2011-12-16 | Disposition: A | Discharge status: Home / Self Care | Payer: Medicaid Other | Source: Home / Self Care | Attending: Emergency Medicine | Admitting: Emergency Medicine

## 2011-12-16 ENCOUNTER — Encounter (HOSPITAL_COMMUNITY): Payer: Self-pay | Admitting: *Deleted

## 2011-12-16 DIAGNOSIS — M542 Cervicalgia: Secondary | ICD-10-CM | POA: Insufficient documentation

## 2011-12-16 DIAGNOSIS — R51 Headache: Secondary | ICD-10-CM | POA: Insufficient documentation

## 2011-12-16 DIAGNOSIS — S139XXA Sprain of joints and ligaments of unspecified parts of neck, initial encounter: Secondary | ICD-10-CM | POA: Insufficient documentation

## 2011-12-16 DIAGNOSIS — W19XXXA Unspecified fall, initial encounter: Secondary | ICD-10-CM | POA: Insufficient documentation

## 2011-12-16 DIAGNOSIS — K219 Gastro-esophageal reflux disease without esophagitis: Secondary | ICD-10-CM | POA: Insufficient documentation

## 2011-12-16 DIAGNOSIS — J45909 Unspecified asthma, uncomplicated: Secondary | ICD-10-CM | POA: Insufficient documentation

## 2011-12-16 DIAGNOSIS — Z79899 Other long term (current) drug therapy: Secondary | ICD-10-CM | POA: Insufficient documentation

## 2011-12-16 DIAGNOSIS — R569 Unspecified convulsions: Secondary | ICD-10-CM

## 2011-12-16 DIAGNOSIS — I10 Essential (primary) hypertension: Secondary | ICD-10-CM | POA: Insufficient documentation

## 2011-12-16 DIAGNOSIS — S161XXA Strain of muscle, fascia and tendon at neck level, initial encounter: Secondary | ICD-10-CM

## 2011-12-16 DIAGNOSIS — Z9089 Acquired absence of other organs: Secondary | ICD-10-CM | POA: Insufficient documentation

## 2011-12-16 LAB — COMPREHENSIVE METABOLIC PANEL
Albumin: 4.6 g/dL (ref 3.5–5.2)
Alkaline Phosphatase: 73 U/L (ref 39–117)
BUN: 9 mg/dL (ref 6–23)
CO2: 23 mEq/L (ref 19–32)
Chloride: 101 mEq/L (ref 96–112)
GFR calc non Af Amer: >90 mL/min (ref >90)
Glucose, Bld: 91 mg/dL (ref 70–99)
Potassium: 3.9 mEq/L (ref 3.5–5.1)
Total Bilirubin: 0.5 mg/dL (ref 0.3–1.2)

## 2011-12-16 LAB — CBC WITH DIFFERENTIAL/PLATELET
Basophils Relative: 0 % (ref 0–1)
HCT: 40.5 % (ref 39.0–52.0)
Hemoglobin: 14.2 g/dL (ref 13.0–17.0)
Lymphocytes Relative: 25 % (ref 12–46)
Lymphs Abs: 1.8 10*3/uL (ref 0.7–4.0)
Monocytes Relative: 9 % (ref 3–12)
Neutro Abs: 4.4 10*3/uL (ref 1.7–7.7)
Neutrophils Relative %: 61 % (ref 43–77)
RBC: 4.81 MIL/uL (ref 4.22–5.81)
WBC: 7.3 10*3/uL (ref 4.0–10.5)

## 2011-12-16 LAB — LIPASE, BLOOD: Lipase: 24 U/L (ref 11–59)

## 2011-12-16 LAB — URINALYSIS, ROUTINE W REFLEX MICROSCOPIC
Bilirubin Urine: NEGATIVE
Ketones, ur: NEGATIVE mg/dL
Nitrite: NEGATIVE
pH: 6 (ref 5.0–8.0)

## 2011-12-16 LAB — GLUCOSE, CAPILLARY: Glucose-Capillary: 109 mg/dL — ABNORMAL HIGH (ref 70–99)

## 2011-12-16 MED ORDER — HYDROCODONE-ACETAMINOPHEN 5-325 MG PO TABS: 1.0000 | ORAL_TABLET | ORAL | Status: AC | PRN

## 2011-12-16 MED ORDER — MORPHINE SULFATE 4 MG/ML IJ SOLN
4.0000 mg | Freq: Once | INTRAMUSCULAR | Status: AC
  Administered 2011-12-16: 4 mg via INTRAVENOUS
  Filled 2011-12-16: qty 1

## 2011-12-16 MED ORDER — SODIUM CHLORIDE 0.9 % IV BOLUS (SEPSIS): 1000.0000 mL | Freq: Once | INTRAVENOUS | Status: DC

## 2011-12-16 MED ORDER — DIAZEPAM 5 MG/ML IJ SOLN
INTRAMUSCULAR | Status: AC
  Filled 2011-12-16: qty 2

## 2011-12-16 MED ORDER — SODIUM CHLORIDE 0.9 % IV SOLN
Freq: Once | INTRAVENOUS | Status: AC
  Administered 2011-12-16: 17:00:00 via INTRAVENOUS

## 2011-12-16 MED ORDER — ONDANSETRON HCL 4 MG/2ML IJ SOLN
4.0000 mg | Freq: Once | INTRAMUSCULAR | Status: AC
  Administered 2011-12-16: 4 mg via INTRAVENOUS
  Filled 2011-12-16: qty 2

## 2011-12-16 MED ORDER — DIAZEPAM 5 MG/ML IJ SOLN
20.0000 mg | Freq: Once | INTRAMUSCULAR | Status: AC
  Administered 2011-12-16: 20 mg via INTRAVENOUS

## 2011-12-16 NOTE — ED Notes (Signed)
[facility] does not have a truck 
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
## 2011-12-16 NOTE — ED Notes (Signed)
Pt removed self from spine board.  Trying to pull out IV. Pt pulled off c-collar  St's he wants up.  IV removed due to pt c/o pain at IV site. C-collar placed back on pt. And explained importance of leaving it in place.  St's he wants something for headache.  Updated pt on plan of care of CT of neck due to neck pain.  C-collar remains in place.

## 2011-12-16 NOTE — ED Notes (Signed)
Pt     [facility]  [facility]  [facility]  When  He  Had  A  Seizure         In the  Parking  Lot  He had  Been  C/o  Some  Chest  Pain  Prior  To the  Seizure  Activity     He      Had  Been  C/o  Chest  Pain      Pt    Was      Placed  On  A  Backboard          With         c  Spine  immobilzation         He  Was      Combative    And   Uncooperative       He    Has  A  History  Of  Seizures     And  According  To  Family  He     Has  Been   Taking  His  meds

## 2011-12-16 NOTE — ED Notes (Signed)
Pt requesting pain med for headache

## 2011-12-16 NOTE — ED Notes (Signed)
ems    Here  Pt  Remains  On  Backboard   c   Collar   And        Monitor  Less combative    Talking  At  Intervals  wallett  Given to  father

## 2011-12-16 NOTE — ED Notes (Signed)
Patient was brought in by ambulance with complaint of witnessed seizure at the Urgent Care parking lot. Pt is awake but confused. Patient is noted to be combative, insisting on having the IV site pulled from the rt hand.

## 2011-12-16 NOTE — ED Provider Notes (Signed)
History     CSN: [id number]  Arrival date & time 12/16/11  1658   First MD Initiated Contact with Patient 12/16/11 1700      Chief Complaint  Patient presents with  . Seizures    (Consider location/radiation/quality/duration/timing/severity/associated sxs/prior treatment) HPI Pt coming from urgent care for seizure witnessed pt parents lasting 1 min. Post ictal and combative c/o neck pain. C-collar placed and transferred to ED. Pt last had seizure 1 month ago. Pt states he had some chest pain prior to seizure. Normal cath in may. Pt only c/o of post cervical pain and scalp pain at this time.  Past Medical History  Diagnosis Date  . Diverticulosis   . Hypertension   . Seizures   . Asthma   . Acid reflux   . Migraines   . Depression   . Mood swings   . Chest pain with normal coronary angiography     a.  09/2011 Cath:  Normal Cors, NL LV fxn  . Sleep apnea     a. On CPAP  . Kidney stones     Past Surgical History  Procedure Date  . Hernia repair   . Cholecystectomy   . Tonsillectomy   . Neck surgery   . Wrist fusion left  . Cardiac catheterization     No family history on file.  History  Substance Use Topics  . Smoking status: Former Games developer  . Smokeless tobacco: Never Used  . Alcohol Use: No      Review of Systems  Constitutional: Negative for fever and chills.  HENT: Positive for neck pain. Negative for neck stiffness.   Eyes: Negative for visual disturbance.  Cardiovascular: Positive for chest pain.  Gastrointestinal: Positive for nausea. Negative for vomiting and abdominal pain.  Genitourinary: Negative for flank pain.  Skin: Negative for rash and wound.  Neurological: Positive for seizures and headaches. Negative for dizziness, weakness and numbness.    Allergies  Bee venom; Meloxicam; Penicillins; Sulfa antibiotics; Ketorolac tromethamine; and Lortab  Home Medications   Current Outpatient Rx  Name Route Sig Dispense Refill  . ALBUTEROL SULFATE  HFA 108 (90 BASE) MCG/ACT IN AERS Inhalation Inhale 2 puffs into the lungs every 6 (six) hours as needed. For shortness of breath    . LAMOTRIGINE 100 MG PO TABS Oral Take 200 mg by mouth 2 (two) times daily.     Marland Kitchen LISINOPRIL-HYDROCHLOROTHIAZIDE 20-12.5 MG PO TABS Oral Take 1 tablet by mouth daily.      Marland Kitchen LORATADINE 10 MG PO TABS Oral Take 10 mg by mouth daily.      . MULTI-VITAMIN/MINERALS PO TABS Oral Take 1 tablet by mouth 2 (two) times daily.     Marland Kitchen OMEPRAZOLE 20 MG PO CPDR Oral Take 20 mg by mouth daily.    Marland Kitchen RANITIDINE HCL 150 MG PO TABS Oral Take 150 mg by mouth 2 (two) times daily.    . SERTRALINE HCL 100 MG PO TABS Oral Take 200 mg by mouth daily.     . SUCRALFATE 1 G PO TABS Oral Take 1 g by mouth 2 (two) times daily.      . SUMATRIPTAN SUCCINATE 100 MG PO TABS Oral Take 100 mg by mouth every 2 (two) hours as needed. For migraine    . EPINEPHRINE 0.15 MG/0.3ML IJ DEVI Intramuscular Inject 0.15 mg into the muscle as needed.     Marland Kitchen HYDROCODONE-ACETAMINOPHEN 5-325 MG PO TABS Oral Take 1 tablet by mouth every 4 (four) hours as needed  for pain. 10 tablet 0    BP 125/59  Pulse 100  Temp 98 F (36.7 C) (Oral)  Resp 22  Ht 5\' 9"  (1.753 m)  Wt 250 lb (113.399 kg)  BMI 36.92 kg/m2  SpO2 100%  Physical Exam  Nursing note and vitals reviewed. Constitutional: He is oriented to person, place, and time. He appears well-developed and well-nourished. No distress.  HENT:  Head: Normocephalic and atraumatic.  Mouth/Throat: Oropharynx is clear and moist.       TTP over post scalp. No definite evidence of trauma  Eyes: EOM are normal. Pupils are equal, round, and reactive to light.  Neck: Normal range of motion. Neck supple.       TTP over posterior neck without focality  Cardiovascular: Normal rate and regular rhythm.   Pulmonary/Chest: Effort normal and breath sounds normal. No respiratory distress. He has no wheezes. He has no rales.  Abdominal: Soft. Bowel sounds are normal. There is no  tenderness. There is no rebound and no guarding.  Musculoskeletal: Normal range of motion. He exhibits no edema and no tenderness.  Neurological: He is alert and oriented to person, place, and time.       5/5 motor in all ext  Skin: Skin is warm and dry. No rash noted. No erythema.  Psychiatric:       bazaar behavior. Initially combative but calmed when R hand IV removed    ED Course  Procedures (including critical care time)  Labs Reviewed  COMPREHENSIVE METABOLIC PANEL - Abnormal; Notable for the following:    ALT 56 (*)     All other components within normal limits  CBC WITH DIFFERENTIAL  LIPASE, BLOOD  URINALYSIS, ROUTINE W REFLEX MICROSCOPIC  POCT I-STAT TROPONIN I   Ct Head Wo Contrast  12/16/2011  *RADIOLOGY REPORT*  Clinical Data:  Witnessed seizure.  Head pain.  Neck pain  CT HEAD WITHOUT CONTRAST CT CERVICAL SPINE WITHOUT CONTRAST  Technique:  Multidetector CT imaging of the head and cervical spine was performed following the standard protocol without intravenous contrast.  Multiplanar CT image reconstructions of the cervical spine were also generated.  Comparison:  10/18/2011  CT HEAD  Findings: There is no evidence for acute infarction, intracranial hemorrhage, mass lesion, hydrocephalus, or extra-axial fluid. There is no atrophy or white matter disease. Calvarium intact. Calvarium intact.  No acute sinus disease.  Mastoids well well- aerated.  IMPRESSION: Negative exam.  CT CERVICAL SPINE  Findings: No visible cervical spine fracture or traumatic subluxation.  No prevertebral soft tissue swelling or intraspinal hematoma.  There has been previous attempt at C1-C2 fusion on the left with a left transpedicular/lateral mass screw.  There is no solid C1-2 fusion, either with regard to a posterior bony mass or with regard to the screw, as there is periscrew loosening.  No neck masses.  Clear lung apices.  IMPRESSION: No acute cervical spine abnormality.  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  Original  Report Authenticated By: Elsie Stain, M.D.   Ct Cervical Spine Wo Contrast  12/16/2011  *RADIOLOGY REPORT*  Clinical Data:  Witnessed seizure.  Head pain.  Neck pain  CT HEAD WITHOUT CONTRAST CT CERVICAL SPINE WITHOUT CONTRAST  Technique:  Multidetector CT imaging of the head and cervical spine was performed following the standard protocol without intravenous contrast.  Multiplanar CT image reconstructions of the cervical spine were also generated.  Comparison:  10/18/2011  CT HEAD  Findings: There is no evidence for acute infarction, intracranial hemorrhage, mass lesion, hydrocephalus,  or extra-axial fluid. There is no atrophy or white matter disease. Calvarium intact. Calvarium intact.  No acute sinus disease.  Mastoids well well- aerated.  IMPRESSION: Negative exam.  CT CERVICAL SPINE  Findings: No visible cervical spine fracture or traumatic subluxation.  No prevertebral soft tissue swelling or intraspinal hematoma.  There has been previous attempt at C1-C2 fusion on the left with a left transpedicular/lateral mass screw.  There is no solid C1-2 fusion, either with regard to a posterior bony mass or with regard to the screw, as there is periscrew loosening.  No neck masses.  Clear lung apices.  IMPRESSION: No acute cervical spine abnormality.  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  Original Report Authenticated By: Elsie Stain, M.D.     1. Seizure   2. Neck strain      Date: 12/16/2011  Rate: 70  Rhythm: normal sinus rhythm  QRS Axis: normal  Intervals: normal  ST/T Wave abnormalities: normal  Conduction Disutrbances:none  Narrative Interpretation:   Old EKG Reviewed: none available    MDM   Pt alert and oriented in ED. Will d/c home. F/U with PMD.        Loren Racer, MD 12/16/11 2016

## 2011-12-16 NOTE — ED Notes (Signed)
Notified charge nurse.  

## 2011-12-16 NOTE — ED Notes (Signed)
Pt  Placed  On  Cardiac      Monitor   Nasal  02  At  2  l  /  Min           Iv  Valium  20  Mg  Total in  5  Mg  Increments  Given   At   1615  1620  1625  And  1630        c  Spine  Immobilization              Continued    ekg     Done

## 2011-12-16 NOTE — ED Notes (Signed)
Pt        Becoming      More  Calm         After meds                ems  [facility]

## 2011-12-16 NOTE — ED Notes (Signed)
Ranae Palms, MD informed on the delay of the I stat Trop results; phlebotomist will redraw for fresh sample

## 2011-12-16 NOTE — ED Notes (Signed)
Notified [facility] 

## 2011-12-31 ENCOUNTER — Encounter (HOSPITAL_COMMUNITY): Payer: Self-pay | Admitting: *Deleted

## 2011-12-31 ENCOUNTER — Emergency Department (HOSPITAL_COMMUNITY): Payer: Medicaid Other

## 2011-12-31 ENCOUNTER — Emergency Department (HOSPITAL_COMMUNITY)
Admission: EM | Admit: 2011-12-31 | Discharge: 2011-12-31 | Disposition: A | Discharge status: Home / Self Care | Payer: Medicaid Other | Attending: Emergency Medicine | Admitting: Emergency Medicine

## 2011-12-31 DIAGNOSIS — M542 Cervicalgia: Secondary | ICD-10-CM | POA: Insufficient documentation

## 2011-12-31 DIAGNOSIS — S161XXA Strain of muscle, fascia and tendon at neck level, initial encounter: Secondary | ICD-10-CM

## 2011-12-31 DIAGNOSIS — G40802 Other epilepsy, not intractable, without status epilepticus: Secondary | ICD-10-CM | POA: Insufficient documentation

## 2011-12-31 DIAGNOSIS — S139XXA Sprain of joints and ligaments of unspecified parts of neck, initial encounter: Secondary | ICD-10-CM | POA: Insufficient documentation

## 2011-12-31 DIAGNOSIS — R404 Transient alteration of awareness: Secondary | ICD-10-CM | POA: Insufficient documentation

## 2011-12-31 DIAGNOSIS — R5381 Other malaise: Secondary | ICD-10-CM | POA: Insufficient documentation

## 2011-12-31 DIAGNOSIS — Z981 Arthrodesis status: Secondary | ICD-10-CM | POA: Insufficient documentation

## 2011-12-31 DIAGNOSIS — I10 Essential (primary) hypertension: Secondary | ICD-10-CM | POA: Insufficient documentation

## 2011-12-31 DIAGNOSIS — R296 Repeated falls: Secondary | ICD-10-CM | POA: Insufficient documentation

## 2011-12-31 DIAGNOSIS — R5383 Other fatigue: Secondary | ICD-10-CM | POA: Insufficient documentation

## 2011-12-31 DIAGNOSIS — Z79899 Other long term (current) drug therapy: Secondary | ICD-10-CM | POA: Insufficient documentation

## 2011-12-31 DIAGNOSIS — R569 Unspecified convulsions: Secondary | ICD-10-CM

## 2011-12-31 MED ORDER — DIAZEPAM 5 MG PO TABS
5.0000 mg | ORAL_TABLET | Freq: Once | ORAL | Status: AC
  Administered 2011-12-31: 5 mg via ORAL
  Filled 2011-12-31: qty 1

## 2011-12-31 MED ORDER — OXYCODONE-ACETAMINOPHEN 5-325 MG PO TABS
1.0000 | ORAL_TABLET | Freq: Once | ORAL | Status: AC
  Administered 2011-12-31: 1 via ORAL
  Filled 2011-12-31: qty 1

## 2011-12-31 MED ORDER — OXYCODONE-ACETAMINOPHEN 5-325 MG PO TABS: 1.0000 | ORAL_TABLET | Freq: Four times a day (QID) | ORAL | Status: AC | PRN

## 2011-12-31 MED ORDER — CYCLOBENZAPRINE HCL 10 MG PO TABS: 10.0000 mg | ORAL_TABLET | Freq: Two times a day (BID) | ORAL | Status: AC | PRN

## 2011-12-31 MED ORDER — IBUPROFEN 200 MG PO TABS
600.0000 mg | ORAL_TABLET | Freq: Once | ORAL | Status: AC
  Administered 2011-12-31: 600 mg via ORAL
  Filled 2011-12-31: qty 2
  Filled 2011-12-31: qty 1

## 2011-12-31 NOTE — ED Notes (Signed)
MD at bedside. 

## 2011-12-31 NOTE — ED Provider Notes (Signed)
History     CSN: [id number]  Arrival date & time 12/31/11  1255   First MD Initiated Contact with Patient 12/31/11 1330      Chief Complaint  Patient presents with  . Seizures    (Consider location/radiation/quality/duration/timing/severity/associated sxs/prior treatment) HPI Comments: Pt states he is told that he had a seizure while standing on the sidewalk outside of a pizza restaurant.  He states he ate lunch and walked outside but cannot remember anything else.  Per a bystander's report, he fell face forward onto the sidewalk and began seizing.  He denies recently missing med doses, alcohol use, or sleep deprivation.  He c/o severe neck and upper back pain.  Patient is a 31 y.o. male presenting with seizures. The history is provided by the patient and the EMS personnel. No language interpreter was used.  Seizures  This is a recurrent problem. The current episode started less than 1 hour ago. The problem has not changed since onset.There was 1 seizure. The most recent episode lasted 30 to 120 seconds. Associated symptoms include sleepiness. Pertinent negatives include patient does not experience confusion, no headaches, no speech difficulty, no neck stiffness, no sore throat, no chest pain, no cough, no nausea, no vomiting, no diarrhea and no muscle weakness. Characteristics include eye blinking, rhythmic jerking and loss of consciousness. Characteristics do not include bowel incontinence, bladder incontinence, bit tongue, apnea or cyanosis. The episode was witnessed. There was no sensation of an aura present. The seizures did not continue in the ED. Possible causes do not include med or dosage change, sleep deprivation, missed seizure meds, recent illness or change in alcohol use. There has been no fever.    Past Medical History  Diagnosis Date  . Diverticulosis   . Hypertension   . Seizures   . Asthma   . Acid reflux   . Migraines   . Depression   . Mood swings   . Chest pain with  normal coronary angiography     a.  09/2011 Cath:  Normal Cors, NL LV fxn  . Sleep apnea     a. On CPAP  . Kidney stones     Past Surgical History  Procedure Date  . Hernia repair   . Cholecystectomy   . Tonsillectomy   . Neck surgery   . Wrist fusion left  . Cardiac catheterization     No family history on file.  History  Substance Use Topics  . Smoking status: Former Games developer  . Smokeless tobacco: Never Used  . Alcohol Use: No      Review of Systems  Constitutional: Positive for fatigue. Negative for fever, chills, diaphoresis, activity change and appetite change.  HENT: Positive for neck pain and neck stiffness. Negative for hearing loss, ear pain, nosebleeds, congestion, sore throat, facial swelling, rhinorrhea, drooling, mouth sores, trouble swallowing and dental problem.        C/o severe, nearly 10/10 neck and upper back pain since fall.  c-collar and backboard applied on scene,  Eyes: Negative for pain and redness.  Respiratory: Negative for apnea, cough, choking, chest tightness, wheezing and stridor.   Cardiovascular: Negative for chest pain, palpitations, leg swelling and cyanosis.  Gastrointestinal: Negative for nausea, vomiting, abdominal pain, diarrhea, constipation, abdominal distention and bowel incontinence.  Genitourinary: Negative for bladder incontinence, flank pain and difficulty urinating.  Musculoskeletal: Positive for back pain. Negative for myalgias, joint swelling, arthralgias and gait problem.  Neurological: Positive for seizures and loss of consciousness. Negative for tremors, speech  difficulty, weakness, numbness and headaches.  Hematological: Negative for adenopathy.  Psychiatric/Behavioral: Negative for confusion.    Allergies  Bee venom; Meloxicam; Penicillins; Sulfa antibiotics; Ketorolac tromethamine; and Lortab  Home Medications   Current Outpatient Rx  Name Route Sig Dispense Refill  . ALBUTEROL SULFATE HFA 108 (90 BASE) MCG/ACT IN  AERS Inhalation Inhale 2 puffs into the lungs every 6 (six) hours as needed. For shortness of breath    . ASPIRIN 81 MG PO TABS Oral Take 81 mg by mouth daily.    Marland Kitchen EPINEPHRINE 0.15 MG/0.3ML IJ DEVI Intramuscular Inject 0.15 mg into the muscle as needed.     Marland Kitchen LAMOTRIGINE 100 MG PO TABS Oral Take 200 mg by mouth 2 (two) times daily.     Marland Kitchen LISINOPRIL-HYDROCHLOROTHIAZIDE 20-12.5 MG PO TABS Oral Take 1 tablet by mouth daily.     Marland Kitchen LORATADINE 10 MG PO TABS Oral Take 10 mg by mouth daily.      . MULTI-VITAMIN/MINERALS PO TABS Oral Take 1 tablet by mouth 2 (two) times daily.     Marland Kitchen OMEPRAZOLE 20 MG PO CPDR Oral Take 20 mg by mouth daily.    Marland Kitchen RANITIDINE HCL 150 MG PO TABS Oral Take 150 mg by mouth 2 (two) times daily.    . SERTRALINE HCL 100 MG PO TABS Oral Take 200 mg by mouth daily.     . SUCRALFATE 1 G PO TABS Oral Take 1 g by mouth 2 (two) times daily.      . SUMATRIPTAN SUCCINATE 100 MG PO TABS Oral Take 100 mg by mouth every 2 (two) hours as needed. For migraine      BP 122/62  Pulse 87  Temp 97.8 F (36.6 C) (Oral)  Resp 16  SpO2 96%  Physical Exam  Constitutional: He is oriented to person, place, and time. He appears well-developed and well-nourished. He is active and cooperative.  Non-toxic appearance. He does not have a sickly appearance. He does not appear ill. No distress. He is not intubated. Cervical collar in place.  HENT:  Head: Normocephalic and atraumatic. Not macrocephalic and not microcephalic. Head is without raccoon's eyes, without Battle's sign and without laceration. No trismus in the jaw.  Right Ear: Hearing, tympanic membrane, external ear and ear canal normal. No swelling or tenderness. No mastoid tenderness. Tympanic membrane is not perforated. No hemotympanum.  Left Ear: Hearing, tympanic membrane, external ear and ear canal normal. No swelling or tenderness. No mastoid tenderness. Tympanic membrane is not perforated. No hemotympanum.  Nose: Nose normal. No mucosal  edema, rhinorrhea or sinus tenderness. No epistaxis.  Mouth/Throat: Uvula is midline, oropharynx is clear and moist and mucous membranes are normal. No oral lesions. Normal dentition. No dental abscesses, uvula swelling or lacerations. No oropharyngeal exudate.  Eyes: Conjunctivae and EOM are normal. Pupils are equal, round, and reactive to light. Right eye exhibits no chemosis, no discharge and no exudate. Left eye exhibits no chemosis, no discharge and no exudate. Right conjunctiva is not injected. Right conjunctiva has no hemorrhage. Left conjunctiva is not injected. Left conjunctiva has no hemorrhage. No scleral icterus.  Neck: Phonation normal. Normal carotid pulses and no hepatojugular reflux present. Spinous process tenderness and muscular tenderness present. No tracheal tenderness present. Carotid bruit is not present. Tracheal deviation and decreased range of motion present. No edema and no erythema present. No thyromegaly present.       Severe midline pain reproduced.  c-collar remains in place pending imaging.  No step-offs.  Cardiovascular:  Normal rate, regular rhythm, S1 normal, S2 normal, normal heart sounds and normal pulses.  Exam reveals no gallop, no distant heart sounds and no friction rub.   No murmur heard. Pulmonary/Chest: Effort normal and breath sounds normal. No accessory muscle usage or stridor. No apnea, not tachypneic and not bradypneic. He is not intubated. No respiratory distress. He exhibits no mass, no tenderness, no bony tenderness, no crepitus and no retraction.  Abdominal: Soft. Normal appearance and bowel sounds are normal. He exhibits no shifting dullness, no distension, no pulsatile liver, no fluid wave, no abdominal bruit, no ascites, no pulsatile midline mass and no mass. There is no tenderness. There is no rebound and no CVA tenderness.  Musculoskeletal: He exhibits no edema and no tenderness.       No deformities or tenderness reproduced  Lymphadenopathy:    He  has no cervical adenopathy.  Neurological: He is alert and oriented to person, place, and time. He has normal strength. No cranial nerve deficit or sensory deficit. GCS eye subscore is 4. GCS verbal subscore is 5. GCS motor subscore is 6.  Skin: Skin is warm, dry and intact. No abrasion noted. He is not diaphoretic.  Psychiatric: He has a normal mood and affect. His behavior is normal. Judgment normal. His mood appears not anxious. His affect is not angry. His speech is not rapid and/or pressured and not slurred. He is not agitated, not aggressive, not slowed, not withdrawn and not combative. Cognition and memory are normal. He does not exhibit a depressed mood.    ED Course  Procedures (including critical care time)   Labs Reviewed  LAMOTRIGINE LEVEL   No results found.   No diagnosis found.    MDM  Pt presents for evaluation s/p fall onto face during seizure.  He c/o severe neck and upper back pain but is A&O without distress currently.  Plan symptomatic care + imaging.  He does not have a neurologist currently.  Will perform ER obs, if imaging negative, plan ambulate and refer for eval by neurology for adjustment of anti-seizure medications.  No evidence of acute fx on imaging.  Pain reproduced no mostly on right side of neck with no significant midline tenderness. Removed c-collar.  Plan refer pt for evaluation and medication adjustment by the [facility] gr[facility].  Will treat his ongoing discomfort with po percocet and flexeril.  Will d/c home.        Tobin Chad, MD 12/31/11 1600

## 2011-12-31 NOTE — ED Notes (Signed)
Family at bedside. 

## 2011-12-31 NOTE — ED Notes (Signed)
[id number]<AV> Expected date:12/31/11<BR> Expected time:12:47 PM<BR> Means of arrival:Ambulance<BR> Comments:<BR> 89 male seizure

## 2011-12-31 NOTE — ED Notes (Addendum)
Pt complain IV is burning requesting pain medication RN acknowledge

## 2012-01-11 DIAGNOSIS — R569 Unspecified convulsions: Secondary | ICD-10-CM

## 2012-01-12 ENCOUNTER — Encounter: Payer: Self-pay | Admitting: Cardiology

## 2012-01-12 ENCOUNTER — Ambulatory Visit: Payer: Medicaid Other | Admitting: Cardiology

## 2012-01-14 ENCOUNTER — Encounter (HOSPITAL_COMMUNITY): Payer: Self-pay | Admitting: *Deleted

## 2012-01-14 ENCOUNTER — Emergency Department (HOSPITAL_COMMUNITY): Payer: Medicaid Other

## 2012-01-14 ENCOUNTER — Emergency Department (HOSPITAL_COMMUNITY)
Admission: EM | Admit: 2012-01-14 | Discharge: 2012-01-14 | Disposition: A | Discharge status: Home / Self Care | Payer: Medicaid Other | Attending: Emergency Medicine | Admitting: Emergency Medicine

## 2012-01-14 DIAGNOSIS — R569 Unspecified convulsions: Secondary | ICD-10-CM

## 2012-01-14 DIAGNOSIS — M25539 Pain in unspecified wrist: Secondary | ICD-10-CM | POA: Insufficient documentation

## 2012-01-14 DIAGNOSIS — F329 Major depressive disorder, single episode, unspecified: Secondary | ICD-10-CM | POA: Insufficient documentation

## 2012-01-14 DIAGNOSIS — Z79899 Other long term (current) drug therapy: Secondary | ICD-10-CM | POA: Insufficient documentation

## 2012-01-14 DIAGNOSIS — F3289 Other specified depressive episodes: Secondary | ICD-10-CM | POA: Insufficient documentation

## 2012-01-14 DIAGNOSIS — J45909 Unspecified asthma, uncomplicated: Secondary | ICD-10-CM | POA: Insufficient documentation

## 2012-01-14 DIAGNOSIS — Y9301 Activity, walking, marching and hiking: Secondary | ICD-10-CM | POA: Insufficient documentation

## 2012-01-14 DIAGNOSIS — I10 Essential (primary) hypertension: Secondary | ICD-10-CM | POA: Insufficient documentation

## 2012-01-14 DIAGNOSIS — Z9089 Acquired absence of other organs: Secondary | ICD-10-CM | POA: Insufficient documentation

## 2012-01-14 DIAGNOSIS — W010XXA Fall on same level from slipping, tripping and stumbling without subsequent striking against object, initial encounter: Secondary | ICD-10-CM | POA: Insufficient documentation

## 2012-01-14 LAB — URINALYSIS, ROUTINE W REFLEX MICROSCOPIC
Bilirubin Urine: NEGATIVE
Glucose, UA: NEGATIVE mg/dL
Hgb urine dipstick: NEGATIVE
Protein, ur: NEGATIVE mg/dL
Specific Gravity, Urine: 1.02 (ref 1.005–1.030)
Urobilinogen, UA: 0.2 mg/dL (ref 0.0–1.0)

## 2012-01-14 LAB — CBC WITH DIFFERENTIAL/PLATELET
Basophils Absolute: 0 10*3/uL (ref 0.0–0.1)
Basophils Relative: 0 % (ref 0–1)
Eosinophils Relative: 5 % (ref 0–5)
HCT: 43.1 % (ref 39.0–52.0)
Hemoglobin: 15.1 g/dL (ref 13.0–17.0)
MCHC: 35 g/dL (ref 30.0–36.0)
MCV: 85.5 fL (ref 78.0–100.0)
Monocytes Absolute: 0.8 10*3/uL (ref 0.1–1.0)
Monocytes Relative: 8 % (ref 3–12)
RDW: 13.1 % (ref 11.5–15.5)

## 2012-01-14 LAB — POCT I-STAT, CHEM 8
BUN: 9 mg/dL (ref 6–23)
Creatinine, Ser: 1 mg/dL (ref 0.50–1.35)
Glucose, Bld: 75 mg/dL (ref 70–99)
Hemoglobin: 16 g/dL (ref 13.0–17.0)
Potassium: 3.4 mEq/L — ABNORMAL LOW (ref 3.5–5.1)
Sodium: 142 mEq/L (ref 135–145)

## 2012-01-14 MED ORDER — LORAZEPAM 2 MG/ML IJ SOLN
INTRAMUSCULAR | Status: AC
  Filled 2012-01-14: qty 1

## 2012-01-14 MED ORDER — ACETAMINOPHEN 325 MG PO TABS
650.0000 mg | ORAL_TABLET | Freq: Once | ORAL | Status: AC
  Administered 2012-01-14: 650 mg via ORAL
  Filled 2012-01-14: qty 2

## 2012-01-14 MED ORDER — SODIUM CHLORIDE 0.9 % IV SOLN
1000.0000 mg | Freq: Once | INTRAVENOUS | Status: AC
  Administered 2012-01-14: 1000 mg via INTRAVENOUS
  Filled 2012-01-14: qty 10

## 2012-01-14 MED ORDER — LORAZEPAM 2 MG/ML IJ SOLN: 2.0000 mg | Freq: Once | INTRAMUSCULAR | Status: DC

## 2012-01-14 MED ORDER — LORAZEPAM 2 MG/ML IJ SOLN
2.0000 mg | Freq: Once | INTRAMUSCULAR | Status: AC
  Administered 2012-01-14: 2 mg via INTRAVENOUS

## 2012-01-14 MED ORDER — FENTANYL CITRATE 0.05 MG/ML IJ SOLN
25.0000 ug | Freq: Once | INTRAMUSCULAR | Status: AC
  Administered 2012-01-14: 25 ug via INTRAVENOUS
  Filled 2012-01-14: qty 2

## 2012-01-14 MED ORDER — LORAZEPAM 2 MG/ML IJ SOLN
INTRAMUSCULAR | Status: AC
  Administered 2012-01-14: 2 mg via INTRAVENOUS
  Filled 2012-01-14: qty 1

## 2012-01-14 NOTE — ED Notes (Signed)
Pt noted to have full body shaking with pupils fixated downward bilat; placed on 2L O2/[place]; EDP at bedside; orders given; seizure pads placed on side rails

## 2012-01-14 NOTE — ED Notes (Signed)
Pt again noted to have full body shaking as noted before; immediately after second dose of Ativan was given, pt stopped shaking, opened eyes, and asked "what did she just give me?"; pt awake, alert, family at bedside; c/o generalized h/a - requesting pain meds; EDP aware - Fentanyl given as ordered

## 2012-01-14 NOTE — ED Notes (Signed)
Pt states understanding of discharge.

## 2012-01-14 NOTE — ED Provider Notes (Addendum)
History     CSN: [id number]  Arrival date & time 01/14/12  0028   First MD Initiated Contact with Patient 01/14/12 0222      Chief Complaint  Patient presents with  . Seizures  . Fall  . Arm Pain    R     (Consider location/radiation/quality/duration/timing/severity/associated sxs/prior treatment) Patient is a 31 y.o. male presenting with seizures, fall, and arm pain. The history is provided by the patient.  Seizures  This is a chronic problem. The current episode started less than 1 hour ago. The problem has not changed since onset.There were 2 to 3 seizures. The most recent episode lasted less than 30 seconds. Characteristics include eye blinking and rhythmic jerking. Characteristics do not include bit tongue, apnea or cyanosis. The episode was witnessed. The seizures continued in the ED. The seizure(s) had no focality.  Fall The accident occurred yesterday. The fall occurred while walking. He fell from a height of 1 to 2 ft. The point of impact was the right wrist. The pain is at a severity of 2/10. The pain is mild. He was ambulatory at the scene. There was no entrapment after the fall. There was no alcohol use involved in the accident.  Arm Pain    Past Medical History  Diagnosis Date  . Diverticulosis        . Hypertension   . Seizures     Event recorder May, 2013, no significant abnormalities  . Asthma   . Acid reflux   . Migraines   . Depression   . Mood swings   . Chest pain with normal coronary angiography     a.  09/2011 Cath:  Normal Cors, NL LV fxn  . Sleep apnea     a. On CPAP  . Kidney stones     Past Surgical History  Procedure Date  . Hernia repair   . Cholecystectomy   . Tonsillectomy   . Neck surgery   . Wrist fusion left  . Cardiac catheterization     History reviewed. No pertinent family history.  History  Substance Use Topics  . Smoking status: Former Games developer  . Smokeless tobacco: Never Used  . Alcohol Use: No      Review of  Systems  Respiratory: Negative for apnea.   Cardiovascular: Negative for cyanosis.  Neurological: Positive for seizures.  All other systems reviewed and are negative.    Allergies  Bee venom; Meloxicam; Penicillins; Sulfa antibiotics; Ketorolac tromethamine; and Lortab  Home Medications   Current Outpatient Rx  Name Route Sig Dispense Refill  . ALBUTEROL SULFATE HFA 108 (90 BASE) MCG/ACT IN AERS Inhalation Inhale 2 puffs into the lungs every 6 (six) hours as needed. For shortness of breath    . ASPIRIN 81 MG PO TABS Oral Take 81 mg by mouth daily.    Marland Kitchen EPINEPHRINE 0.15 MG/0.3ML IJ DEVI Intramuscular Inject 0.15 mg into the muscle as needed.     Marland Kitchen LAMOTRIGINE 100 MG PO TABS Oral Take 200 mg by mouth 2 (two) times daily.     Marland Kitchen LISINOPRIL-HYDROCHLOROTHIAZIDE 20-12.5 MG PO TABS Oral Take 1 tablet by mouth daily.     Marland Kitchen LORATADINE 10 MG PO TABS Oral Take 10 mg by mouth daily.      . MULTI-VITAMIN/MINERALS PO TABS Oral Take 1 tablet by mouth 2 (two) times daily.     Marland Kitchen OMEPRAZOLE 20 MG PO CPDR Oral Take 20 mg by mouth daily.    Marland Kitchen RANITIDINE HCL 150 MG  PO TABS Oral Take 150 mg by mouth 2 (two) times daily.    . SERTRALINE HCL 100 MG PO TABS Oral Take 200 mg by mouth daily.     . SUCRALFATE 1 G PO TABS Oral Take 1 g by mouth 2 (two) times daily.      . SUMATRIPTAN SUCCINATE 100 MG PO TABS Oral Take 100 mg by mouth every 2 (two) hours as needed. For migraine      BP 137/85  Temp 98.3 F (36.8 C) (Oral)  Resp 18  SpO2 100%  Physical Exam  Constitutional: He is oriented to person, place, and time. He appears well-developed and well-nourished.  HENT:  Head: Normocephalic and atraumatic.  Eyes: Conjunctivae are normal. Pupils are equal, round, and reactive to light.  Neck: Normal range of motion. Neck supple.  Cardiovascular: Normal rate, regular rhythm, normal heart sounds and intact distal pulses.   Pulmonary/Chest: Effort normal and breath sounds normal.  Abdominal: Soft. Bowel sounds  are normal.  Neurological: He is alert and oriented to person, place, and time.  Skin: Skin is warm and dry.  Psychiatric: He has a normal mood and affect. His behavior is normal. Judgment and thought content normal.    ED Course  Procedures (including critical care time)  Labs Reviewed  POCT I-STAT, CHEM 8 - Abnormal; Notable for the following:    Potassium 3.4 (*)     Calcium, Ion 1.25 (*)     All other components within normal limits  CBC WITH DIFFERENTIAL  URINALYSIS, ROUTINE W REFLEX MICROSCOPIC  PHENYTOIN LEVEL, TOTAL   Dg Elbow Complete Right  01/14/2012  *RADIOLOGY REPORT*  Clinical Data: Pain post fall.  Seizures.  RIGHT ELBOW - COMPLETE 3+ VIEW  Comparison: 10/01/2006  Findings: No effusion. Negative for fracture, dislocation, or other acute abnormality.  Normal alignment and mineralization. No significant degenerative change.  Regional soft tissues unremarkable.  IMPRESSION:  Negative  Original Report Authenticated By: Osa Craver, M.D.   Dg Wrist Complete Right  01/14/2012  *RADIOLOGY REPORT*  Clinical Data: Pain post fall.  RIGHT WRIST - COMPLETE 3+ VIEW  Comparison: None.  Findings: Carpal rows intact. Negative for fracture, dislocation, or other acute abnormality.  Normal alignment and mineralization. No significant degenerative change.  Regional soft tissues unremarkable.  IMPRESSION:  Negative  Original Report Authenticated By: Osa Craver, M.D.     No diagnosis found.    MDM  ? Seizure activity in ed.  Improved after antiepilieptics and analgesia.  Await dilantin level, and ct head.     ? Pseudoseizure.  Dilantin therapeutic.  Seizures in ed terminated before pt given medication with no loc, and pt asking what was being given to him.  Ct neg.  Labs otherwise benign.  Will dc to fu outpt,  Ret new/worsening sxs     Arleen Bar Lytle Michaels, MD 01/14/12 0303  Shonia Skilling Lytle Michaels, MD 01/14/12 1610

## 2012-01-14 NOTE — ED Notes (Addendum)
C/o R wrist pain, onset ~ 2300 when he had a sz and fell, pain from wrist to elbow, primarily wrist, mild swelling possible, no other markings or deformities, CMS intact, ROM intact. Pain with movement & palpation. Last sz was Monday. Has recently been placed on dilantin. "Dilantin level was a little high, was instructed to skip a day, and he did, then had a sz tonight". "Want s to be seen for arm pain & not sz, but is not sure d/t recent med changes and blood levels". No oral trauma or incontinance noted/reported. Admits to falls only r/t sz. sz activity seems to becoming more frequent, hence the med changes.

## 2012-02-26 ENCOUNTER — Emergency Department (HOSPITAL_COMMUNITY): Payer: Medicaid Other

## 2012-02-26 ENCOUNTER — Observation Stay (HOSPITAL_COMMUNITY)
Admission: EM | Admit: 2012-02-26 | Discharge: 2012-02-28 | Disposition: A | Discharge status: Home / Self Care | Payer: Medicaid Other | Attending: Internal Medicine | Admitting: Internal Medicine

## 2012-02-26 ENCOUNTER — Encounter (HOSPITAL_COMMUNITY): Payer: Self-pay

## 2012-02-26 DIAGNOSIS — F329 Major depressive disorder, single episode, unspecified: Secondary | ICD-10-CM | POA: Insufficient documentation

## 2012-02-26 DIAGNOSIS — W19XXXA Unspecified fall, initial encounter: Secondary | ICD-10-CM | POA: Insufficient documentation

## 2012-02-26 DIAGNOSIS — K5790 Diverticulosis of intestine, part unspecified, without perforation or abscess without bleeding: Secondary | ICD-10-CM

## 2012-02-26 DIAGNOSIS — J45909 Unspecified asthma, uncomplicated: Secondary | ICD-10-CM | POA: Insufficient documentation

## 2012-02-26 DIAGNOSIS — R569 Unspecified convulsions: Secondary | ICD-10-CM

## 2012-02-26 DIAGNOSIS — H538 Other visual disturbances: Secondary | ICD-10-CM

## 2012-02-26 DIAGNOSIS — G4733 Obstructive sleep apnea (adult) (pediatric): Secondary | ICD-10-CM | POA: Insufficient documentation

## 2012-02-26 DIAGNOSIS — G4730 Sleep apnea, unspecified: Secondary | ICD-10-CM

## 2012-02-26 DIAGNOSIS — G43909 Migraine, unspecified, not intractable, without status migrainosus: Secondary | ICD-10-CM | POA: Insufficient documentation

## 2012-02-26 DIAGNOSIS — N200 Calculus of kidney: Secondary | ICD-10-CM

## 2012-02-26 DIAGNOSIS — I10 Essential (primary) hypertension: Secondary | ICD-10-CM | POA: Insufficient documentation

## 2012-02-26 DIAGNOSIS — F32A Depression, unspecified: Secondary | ICD-10-CM

## 2012-02-26 DIAGNOSIS — G9381 Temporal sclerosis: Secondary | ICD-10-CM

## 2012-02-26 DIAGNOSIS — F3289 Other specified depressive episodes: Secondary | ICD-10-CM | POA: Insufficient documentation

## 2012-02-26 DIAGNOSIS — D649 Anemia, unspecified: Secondary | ICD-10-CM | POA: Insufficient documentation

## 2012-02-26 DIAGNOSIS — G40909 Epilepsy, unspecified, not intractable, without status epilepticus: Principal | ICD-10-CM | POA: Insufficient documentation

## 2012-02-26 DIAGNOSIS — Y92009 Unspecified place in unspecified non-institutional (private) residence as the place of occurrence of the external cause: Secondary | ICD-10-CM | POA: Insufficient documentation

## 2012-02-26 DIAGNOSIS — K219 Gastro-esophageal reflux disease without esophagitis: Secondary | ICD-10-CM

## 2012-02-26 DIAGNOSIS — R079 Chest pain, unspecified: Secondary | ICD-10-CM

## 2012-02-26 MED ORDER — SODIUM CHLORIDE 0.9 % IV SOLN
Freq: Once | INTRAVENOUS | Status: AC
  Administered 2012-02-26: 21:00:00 via INTRAVENOUS

## 2012-02-26 MED ORDER — PROMETHAZINE HCL 25 MG/ML IJ SOLN
25.0000 mg | Freq: Once | INTRAMUSCULAR | Status: AC
  Administered 2012-02-26: 25 mg via INTRAVENOUS
  Filled 2012-02-26: qty 1

## 2012-02-26 MED ORDER — LORAZEPAM 2 MG/ML IJ SOLN
2.0000 mg | Freq: Once | INTRAMUSCULAR | Status: AC
  Administered 2012-02-26: 2 mg via INTRAVENOUS

## 2012-02-26 MED ORDER — MORPHINE SULFATE 4 MG/ML IJ SOLN: 4.0000 mg | Freq: Once | INTRAMUSCULAR | Status: DC

## 2012-02-26 MED ORDER — IBUPROFEN 800 MG PO TABS
800.0000 mg | ORAL_TABLET | Freq: Once | ORAL | Status: AC
  Administered 2012-02-26: 800 mg via ORAL
  Filled 2012-02-26: qty 1

## 2012-02-26 MED ORDER — LORAZEPAM 2 MG/ML IJ SOLN
INTRAMUSCULAR | Status: AC
  Filled 2012-02-26: qty 1

## 2012-02-26 MED ORDER — SODIUM CHLORIDE 0.9 % IV SOLN
1000.0000 mg | Freq: Once | INTRAVENOUS | Status: AC
  Administered 2012-02-26 (×2): 1000 mg via INTRAVENOUS
  Filled 2012-02-26: qty 20

## 2012-02-26 MED ORDER — LORAZEPAM 2 MG/ML IJ SOLN
2.0000 mg | Freq: Once | INTRAMUSCULAR | Status: AC
  Administered 2012-02-26: 2 mg via INTRAVENOUS
  Filled 2012-02-26: qty 1

## 2012-02-26 MED ORDER — SODIUM CHLORIDE 0.9 % IV BOLUS (SEPSIS)
500.0000 mL | Freq: Once | INTRAVENOUS | Status: AC
  Administered 2012-02-26: 500 mL via INTRAVENOUS

## 2012-02-26 NOTE — ED Notes (Signed)
Family came to this RN upon returning from CT w/a critical pt requesting pain meds, family informed we would talk w/Dr. Rubin Payor once we had critical pt better situated. Family continue to press call bell after speaking w/this RN and Animator

## 2012-02-26 NOTE — ED Provider Notes (Addendum)
History     CSN: [id number]  Arrival date & time 02/26/12  1732   First MD Initiated Contact with Patient 02/26/12 1753      Chief Complaint  Patient presents with  . Seizures    (Consider location/radiation/quality/duration/timing/severity/associated sxs/prior treatment) Patient is a 31 y.o. male presenting with seizures. The history is provided by the patient.  Seizures  This is a chronic problem. Associated symptoms include headaches and visual disturbances. Pertinent negatives include no chest pain, no nausea, no vomiting and no diarrhea.   patient with a seizure. He has a history of same. Reportedly had 2 tonic-clonic seizures today. One was witnessed by his mother. She states that he fell and she broke her fall, however he did hit his head. He states he does have a severe headache now and he is seeing 3 of everything. He states he does get headaches after the seizures but did not like this. Patient states he was seen yesterday at [facility] for the same thing. He states he checked blood work and said that his Dilantin level is low. Patient states it is low because he is on phenytoin and not Dilantin. He states that the phenytoin doesn't stay in his blood. He is also on Vimpat. No fevers. No chest pain. No nausea. He states that the light bothers him now. Patient states that he sees 3 of everything now. He has not had this previously. Patient states that he sees 3  Past Medical History  Diagnosis Date  . Diverticulosis        . Hypertension   . Seizures     Event recorder May, 2013, no significant abnormalities  . Asthma   . Acid reflux   . Migraines   . Depression   . Mood swings   . Chest pain with normal coronary angiography     a.  09/2011 Cath:  Normal Cors, NL LV fxn  . Sleep apnea     a. On CPAP  . Kidney stones     Past Surgical History  Procedure Date  . Hernia repair   . Cholecystectomy   . Tonsillectomy   . Neck surgery   . Wrist fusion left  . Cardiac  catheterization     History reviewed. No pertinent family history.  History  Substance Use Topics  . Smoking status: Former Games developer  . Smokeless tobacco: Never Used  . Alcohol Use: No      Review of Systems  Constitutional: Negative for activity change and appetite change.  HENT: Negative for neck stiffness.   Eyes: Positive for visual disturbance. Negative for pain.  Respiratory: Negative for chest tightness and shortness of breath.   Cardiovascular: Negative for chest pain and leg swelling.  Gastrointestinal: Negative for nausea, vomiting, abdominal pain and diarrhea.  Genitourinary: Negative for flank pain.  Musculoskeletal: Negative for back pain.  Skin: Negative for rash.  Neurological: Positive for seizures and headaches. Negative for weakness and numbness.  Psychiatric/Behavioral: Negative for behavioral problems.   patient states that he sees 3 of everything. External ocular movements are intact  Allergies  Bee venom; Meloxicam; Penicillins; Sulfa antibiotics; Ketorolac tromethamine; and Lortab  Home Medications   Current Outpatient Rx  Name Route Sig Dispense Refill  . ALBUTEROL SULFATE HFA 108 (90 BASE) MCG/ACT IN AERS Inhalation Inhale 2 puffs into the lungs every 6 (six) hours as needed. For shortness of breath    . ASPIRIN 81 MG PO TABS Oral Take 81 mg by mouth daily.    Marland Kitchen  EPINEPHRINE 0.15 MG/0.3ML IJ DEVI Intramuscular Inject 0.15 mg into the muscle daily as needed. For severe allergic reaction    . LACOSAMIDE 100 MG PO TABS Oral Take 200 mg by mouth 2 (two) times daily.    Marland Kitchen LISINOPRIL-HYDROCHLOROTHIAZIDE 20-12.5 MG PO TABS Oral Take 1 tablet by mouth daily.     Marland Kitchen LORATADINE 10 MG PO TABS Oral Take 10 mg by mouth daily.      . MULTI-VITAMIN/MINERALS PO TABS Oral Take 1 tablet by mouth 2 (two) times daily.     Marland Kitchen OMEPRAZOLE 20 MG PO CPDR Oral Take 20 mg by mouth daily.    Marland Kitchen PHENYTOIN SODIUM EXTENDED 100 MG PO CAPS Oral Take 200 mg by mouth 2 (two) times daily.      Marland Kitchen RANITIDINE HCL 150 MG PO TABS Oral Take 150 mg by mouth 2 (two) times daily.    . SERTRALINE HCL 100 MG PO TABS Oral Take 200 mg by mouth daily.     . SUCRALFATE 1 G PO TABS Oral Take 1 g by mouth 2 (two) times daily.      . SUMATRIPTAN SUCCINATE 100 MG PO TABS Oral Take 100 mg by mouth every 2 (two) hours as needed. For migraine    . VITAMIN C 500 MG PO TABS Oral Take 500 mg by mouth daily.    Marland Kitchen DILANTIN 100 MG PO CAPS  200mg  in morning and 300 mg in evening. 80 capsule 0    Dispense as written.    BP 105/69  Pulse 68  Temp 98 F (36.7 C) (Oral)  Resp 15  SpO2 99%  Physical Exam  Nursing note and vitals reviewed. Constitutional: He is oriented to person, place, and time. He appears well-developed and well-nourished.  HENT:  Head: Normocephalic and atraumatic.  Eyes: EOM are normal. Pupils are equal, round, and reactive to light.  Neck: Normal range of motion. Neck supple.  Cardiovascular: Normal rate, regular rhythm and normal heart sounds.   No murmur heard. Pulmonary/Chest: Effort normal and breath sounds normal.  Abdominal: Soft. Bowel sounds are normal. He exhibits no distension and no mass. There is no tenderness. There is no rebound and no guarding.  Musculoskeletal: Normal range of motion. He exhibits no edema.  Neurological: He is alert and oriented to person, place, and time. No cranial nerve deficit.  Skin: Skin is warm and dry.  Psychiatric: He has a normal mood and affect.    ED Course  Procedures (including critical care time)  Labs Reviewed  PHENYTOIN LEVEL, TOTAL - Abnormal; Notable for the following:    Phenytoin Lvl 5.9 (*)     All other components within normal limits   Ct Head Wo Contrast  02/26/2012  *RADIOLOGY REPORT*  Clinical Data: Multiple seizures, head injury, confusion.  CT HEAD WITHOUT CONTRAST  Technique:  Contiguous axial images were obtained from the base of the skull through the vertex without contrast.  Comparison: 01/20/2012.   Findings: Image quality is degraded by motion.  No evidence of acute infarct, acute hemorrhage, mass lesion, mass effect or hydrocephalus.  Scattered mucosal thickening in the paranasal sinuses.  Mastoid air cells are clear.  IMPRESSION:  1.  Image quality is degraded by motion. 2.  No definite acute findings.   Original Report Authenticated By: Reyes Ivan, M.D.      1. Seizure       MDM  Patient with seizure, history of same. He also had a seizure in the ER. He was  subtherapeutic on his Dilantin. He has been loaded through the IV. He is somewhat sedate after the medications. He states that before the medications he's been seeing 3 of everything. He continues to have a headache also. No relief with medicines, however he had little sleep. Patient was seen in the ER by neurology recommended changing the patient's Dilantin. She did not think that we need further evaluation of the vision changes right now. Continue he will consult his own neurologist tomorrow.        Juliet Rude. Rubin Payor, MD 02/27/12 [phone]  Patient had another seizure while in ER. Discussed again with neurology. They recommended a repeat Dilantin level. He'll be admitted to medicine after that level comes back and other basic lab work comes back now.  Juliet Rude. Rubin Payor, MD 02/27/12 1191

## 2012-02-26 NOTE — ED Notes (Signed)
MD at bedside. EDP Pickering  

## 2012-02-26 NOTE — ED Notes (Signed)
Pt c/o Dilantin burning, rates decreased to 300 ml/hr, pt continued to reports burning to arm, rate then decreased to 250 ml/hr

## 2012-02-26 NOTE — ED Notes (Signed)
Pt reports multiple witnessed seizures x2 days, reports witnessed by family. Pt reports he was seen yesterday at [facility] for the same sts "nothing was done." Pt reports he is taking a generic medication for Dilantin as prescribed.

## 2012-02-26 NOTE — Progress Notes (Signed)
MEDICATION RELATED CONSULT NOTE - INITIAL   Pharmacy Consult for phenytoin load Indication: Seizures  Allergies  Allergen Reactions  . Bee Venom Anaphylaxis  . Meloxicam Hives  . Penicillins Hives  . Sulfa Antibiotics Hives  . Ketorolac Tromethamine Itching  . Lortab (Hydrocodone-Acetaminophen) Hives    Patient Measurements:   Actual Body Weight: 113kg  Vital Signs: Temp: 98 F (36.7 C) (09/27 1735) Temp src: Oral (09/27 1735) BP: 109/59 mmHg (09/27 1900) Pulse Rate: 85  (09/27 1900) Intake/Output from previous day:   Intake/Output from this shift:    Labs: No results found for this basename: WBC:3,HGB:3,HCT:3,PLT:3,APTT:3;INR:3,CREATININE:3,LABCREA:3,CREATININE:3,CREAT24HRUR:3,MG:3,PHOS:3,ALBUMIN:3,PROT:3,ALBUMIN:3,AST:3,ALT:3,ALKPHOS:3,BILITOT:3,BILIDIR:3,IBILI:3 in the last 72 hours The CrCl is unknown because both a height and weight (above a minimum accepted value) are required for this calculation.   Microbiology: No results found for this or any previous visit (from the past 720 hour(s)).  Medical History: Past Medical History  Diagnosis Date  . Diverticulosis        . Hypertension   . Seizures     Event recorder May, 2013, no significant abnormalities  . Asthma   . Acid reflux   . Migraines   . Depression   . Mood swings   . Chest pain with normal coronary angiography     a.  09/2011 Cath:  Normal Cors, NL LV fxn  . Sleep apnea     a. On CPAP  . Kidney stones     Medications:  Phenytoin 200mg  bid pta  Assessment: 31 year old male presents to [facility]  with witnessed seizure x2days. Phenytoin level is low at 5.9. Will bolus 1g of phenytoin to give patient expected level of 15-20. Patient reports taking his seizure medication as prescribed.  Goal of Therapy:  Phenytoin level of 10-20  Plan:  Phenytoin 1g IV load  Severiano Gilbert 02/26/2012,7:50 PM

## 2012-02-26 NOTE — ED Notes (Signed)
EMS reports that patient had a seizure this  PM.  States that he was not responsive and twitching on scene.  In the back of the ambulance, patient sat up and began removing his safety belts. Patient agitated and combative during transport. Patient calmed when ambulance pulled into ED dock. Patient had no post ictal state post seizure and no trauma noted per EMS.

## 2012-02-27 ENCOUNTER — Encounter (HOSPITAL_COMMUNITY): Payer: Self-pay | Admitting: Internal Medicine

## 2012-02-27 ENCOUNTER — Observation Stay (HOSPITAL_COMMUNITY): Payer: Medicaid Other

## 2012-02-27 DIAGNOSIS — R079 Chest pain, unspecified: Secondary | ICD-10-CM

## 2012-02-27 DIAGNOSIS — K219 Gastro-esophageal reflux disease without esophagitis: Secondary | ICD-10-CM

## 2012-02-27 DIAGNOSIS — R569 Unspecified convulsions: Secondary | ICD-10-CM

## 2012-02-27 DIAGNOSIS — F3289 Other specified depressive episodes: Secondary | ICD-10-CM

## 2012-02-27 DIAGNOSIS — H538 Other visual disturbances: Secondary | ICD-10-CM

## 2012-02-27 DIAGNOSIS — F329 Major depressive disorder, single episode, unspecified: Secondary | ICD-10-CM

## 2012-02-27 LAB — CBC WITH DIFFERENTIAL/PLATELET
Basophils Absolute: 0 10*3/uL (ref 0.0–0.1)
Basophils Relative: 1 % (ref 0–1)
Eosinophils Absolute: 0.3 10*3/uL (ref 0.0–0.7)
Eosinophils Relative: 6 % — ABNORMAL HIGH (ref 0–5)
HCT: 35.8 % — ABNORMAL LOW (ref 39.0–52.0)
Hemoglobin: 12.2 g/dL — ABNORMAL LOW (ref 13.0–17.0)
Lymphocytes Relative: 36 % (ref 12–46)
Lymphs Abs: 2 10*3/uL (ref 0.7–4.0)
MCH: 29.4 pg (ref 26.0–34.0)
MCHC: 34.1 g/dL (ref 30.0–36.0)
MCV: 86.3 fL (ref 78.0–100.0)
Monocytes Absolute: 0.5 10*3/uL (ref 0.1–1.0)
Monocytes Relative: 9 % (ref 3–12)
Neutro Abs: 2.6 10*3/uL (ref 1.7–7.7)
Neutrophils Relative %: 49 % (ref 43–77)
Platelets: 177 10*3/uL (ref 150–400)
RBC: 4.15 MIL/uL — ABNORMAL LOW (ref 4.22–5.81)
RDW: 12.9 % (ref 11.5–15.5)
WBC: 5.4 10*3/uL (ref 4.0–10.5)

## 2012-02-27 LAB — BASIC METABOLIC PANEL
CO2: 24 mEq/L (ref 19–32)
Calcium: 9.1 mg/dL (ref 8.4–10.5)
Creatinine, Ser: 0.79 mg/dL (ref 0.50–1.35)
GFR calc non Af Amer: >90 mL/min (ref >90)
Sodium: 141 mEq/L (ref 135–145)

## 2012-02-27 LAB — RAPID URINE DRUG SCREEN, HOSP PERFORMED
Amphetamines: NOT DETECTED
Barbiturates: NOT DETECTED
Benzodiazepines: NOT DETECTED
Cocaine: NOT DETECTED
Opiates: NOT DETECTED
Tetrahydrocannabinol: NOT DETECTED

## 2012-02-27 LAB — PHENYTOIN LEVEL, TOTAL
Phenytoin Lvl: 12.6 ug/mL (ref 10.0–20.0)
Phenytoin Lvl: 12.3 ug/mL (ref 10.0–20.0)

## 2012-02-27 MED ORDER — ACETAMINOPHEN 325 MG PO TABS
650.0000 mg | ORAL_TABLET | Freq: Four times a day (QID) | ORAL | Status: DC | PRN
  Administered 2012-02-27: 650 mg via ORAL
  Filled 2012-02-27: qty 1

## 2012-02-27 MED ORDER — KETOROLAC TROMETHAMINE 30 MG/ML IJ SOLN
30.0000 mg | Freq: Four times a day (QID) | INTRAMUSCULAR | Status: DC | PRN
  Administered 2012-02-27 – 2012-02-28 (×2): 30 mg via INTRAVENOUS
  Filled 2012-02-27 (×2): qty 1

## 2012-02-27 MED ORDER — KETOROLAC TROMETHAMINE 30 MG/ML IJ SOLN
30.0000 mg | Freq: Once | INTRAMUSCULAR | Status: AC
  Administered 2012-02-27: 30 mg via INTRAVENOUS
  Filled 2012-02-27: qty 1

## 2012-02-27 MED ORDER — LORATADINE 10 MG PO TABS
10.0000 mg | ORAL_TABLET | Freq: Every day | ORAL | Status: DC
  Administered 2012-02-27 – 2012-02-28 (×2): 10 mg via ORAL
  Filled 2012-02-27 (×2): qty 1

## 2012-02-27 MED ORDER — ALBUTEROL SULFATE HFA 108 (90 BASE) MCG/ACT IN AERS: 2.0000 | INHALATION_SPRAY | Freq: Four times a day (QID) | RESPIRATORY_TRACT | Status: DC | PRN

## 2012-02-27 MED ORDER — DILANTIN 100 MG PO CAPS: ORAL_CAPSULE | ORAL | Status: DC

## 2012-02-27 MED ORDER — LISINOPRIL 20 MG PO TABS
20.0000 mg | ORAL_TABLET | Freq: Every day | ORAL | Status: DC
  Administered 2012-02-27 – 2012-02-28 (×2): 20 mg via ORAL
  Filled 2012-02-27 (×2): qty 1

## 2012-02-27 MED ORDER — SERTRALINE HCL 100 MG PO TABS
200.0000 mg | ORAL_TABLET | Freq: Every day | ORAL | Status: DC
  Administered 2012-02-27: 200 mg via ORAL
  Filled 2012-02-27 (×2): qty 2

## 2012-02-27 MED ORDER — ADULT MULTIVITAMIN W/MINERALS CH
1.0000 | ORAL_TABLET | Freq: Two times a day (BID) | ORAL | Status: DC
  Administered 2012-02-27 – 2012-02-28 (×3): 1 via ORAL
  Filled 2012-02-27 (×4): qty 1

## 2012-02-27 MED ORDER — SODIUM CHLORIDE 0.9 % IV SOLN
INTRAVENOUS | Status: DC
  Administered 2012-02-27: 500 mL via INTRAVENOUS
  Administered 2012-02-27: 16:00:00 via INTRAVENOUS

## 2012-02-27 MED ORDER — DIPHENHYDRAMINE HCL 50 MG/ML IJ SOLN
12.5000 mg | Freq: Once | INTRAMUSCULAR | Status: AC
  Administered 2012-02-27: 50 mg via INTRAVENOUS
  Filled 2012-02-27: qty 1

## 2012-02-27 MED ORDER — PANTOPRAZOLE SODIUM 40 MG PO TBEC
40.0000 mg | DELAYED_RELEASE_TABLET | Freq: Every day | ORAL | Status: DC
  Administered 2012-02-27 – 2012-02-28 (×2): 40 mg via ORAL
  Filled 2012-02-27 (×2): qty 1

## 2012-02-27 MED ORDER — KETOROLAC TROMETHAMINE 30 MG/ML IJ SOLN: 30.0000 mg | Freq: Once | INTRAMUSCULAR | Status: DC

## 2012-02-27 MED ORDER — SODIUM CHLORIDE 0.9 % IJ SOLN: 3.0000 mL | Freq: Two times a day (BID) | INTRAMUSCULAR | Status: DC

## 2012-02-27 MED ORDER — HYDROCHLOROTHIAZIDE 12.5 MG PO CAPS
12.5000 mg | ORAL_CAPSULE | Freq: Every day | ORAL | Status: DC
  Administered 2012-02-27 – 2012-02-28 (×2): 12.5 mg via ORAL
  Filled 2012-02-27 (×2): qty 1

## 2012-02-27 MED ORDER — VITAMIN C 500 MG PO TABS
500.0000 mg | ORAL_TABLET | Freq: Every day | ORAL | Status: DC
  Administered 2012-02-27 – 2012-02-28 (×2): 500 mg via ORAL
  Filled 2012-02-27 (×2): qty 1

## 2012-02-27 MED ORDER — DIPHENHYDRAMINE HCL 50 MG/ML IJ SOLN
12.5000 mg | Freq: Four times a day (QID) | INTRAMUSCULAR | Status: DC | PRN
  Administered 2012-02-27 – 2012-02-28 (×2): 12.5 mg via INTRAVENOUS
  Filled 2012-02-27 (×2): qty 1

## 2012-02-27 MED ORDER — LISINOPRIL-HYDROCHLOROTHIAZIDE 20-12.5 MG PO TABS: 1.0000 | ORAL_TABLET | Freq: Every day | ORAL | Status: DC

## 2012-02-27 MED ORDER — LACOSAMIDE 200 MG PO TABS
200.0000 mg | ORAL_TABLET | Freq: Two times a day (BID) | ORAL | Status: DC
  Administered 2012-02-27 – 2012-02-28 (×2): 200 mg via ORAL
  Filled 2012-02-27 (×3): qty 1

## 2012-02-27 MED ORDER — PHENYTOIN SODIUM EXTENDED 100 MG PO CAPS
300.0000 mg | ORAL_CAPSULE | Freq: Every day | ORAL | Status: DC
  Administered 2012-02-27: 300 mg via ORAL
  Filled 2012-02-27 (×2): qty 3

## 2012-02-27 MED ORDER — PHENYTOIN 50 MG PO CHEW: 200.0000 mg | CHEWABLE_TABLET | Freq: Every morning | ORAL | Status: DC

## 2012-02-27 MED ORDER — SUMATRIPTAN SUCCINATE 50 MG PO TABS
50.0000 mg | ORAL_TABLET | Freq: Two times a day (BID) | ORAL | Status: DC | PRN
  Administered 2012-02-27 – 2012-02-28 (×2): 50 mg via ORAL
  Filled 2012-02-27 (×3): qty 1

## 2012-02-27 MED ORDER — PHENYTOIN SODIUM EXTENDED 100 MG PO CAPS: 200.0000 mg | ORAL_CAPSULE | Freq: Two times a day (BID) | ORAL | Status: DC

## 2012-02-27 MED ORDER — PHENYTOIN SODIUM EXTENDED 100 MG PO CAPS
200.0000 mg | ORAL_CAPSULE | Freq: Every day | ORAL | Status: DC
  Administered 2012-02-27: 200 mg via ORAL
  Filled 2012-02-27 (×2): qty 2

## 2012-02-27 MED ORDER — PROMETHAZINE HCL 25 MG PO TABS: 12.5000 mg | ORAL_TABLET | Freq: Four times a day (QID) | ORAL | Status: DC | PRN

## 2012-02-27 MED ORDER — PHENYTOIN 50 MG PO CHEW: 300.0000 mg | CHEWABLE_TABLET | Freq: Every evening | ORAL | Status: DC

## 2012-02-27 MED ORDER — ASPIRIN 81 MG PO CHEW
81.0000 mg | CHEWABLE_TABLET | Freq: Every day | ORAL | Status: DC
  Administered 2012-02-27 – 2012-02-28 (×2): 81 mg via ORAL
  Filled 2012-02-27 (×2): qty 1

## 2012-02-27 MED ORDER — FAMOTIDINE 20 MG PO TABS
20.0000 mg | ORAL_TABLET | Freq: Every day | ORAL | Status: DC
  Administered 2012-02-27 – 2012-02-28 (×2): 20 mg via ORAL
  Filled 2012-02-27 (×2): qty 1

## 2012-02-27 MED ORDER — HYDROMORPHONE HCL PF 1 MG/ML IJ SOLN
0.5000 mg | Freq: Once | INTRAMUSCULAR | Status: AC
  Administered 2012-02-27: 0.5 mg via INTRAVENOUS
  Filled 2012-02-27: qty 1

## 2012-02-27 MED ORDER — DIPHENHYDRAMINE HCL 50 MG/ML IJ SOLN
12.5000 mg | Freq: Once | INTRAMUSCULAR | Status: AC
  Administered 2012-02-27: 12.5 mg via INTRAVENOUS
  Filled 2012-02-27: qty 1

## 2012-02-27 NOTE — H&P (Signed)
[facility] History and Physical  Chris Spencer RUE:[id number] DOB: 05-Jun-1980 DOA: 02/26/2012  Referring physician: Dr. Rubin Payor PCP: Toma Deiters, MD   Chief Complaint: Seizures  HPI:   The patient is a 31 year old male with history of migraines, HTN, asthma, GERD, seizure disorder, sleep apnea who presents with seizures.  The patient has a history of seizures since he was an adolescent.  His typical seizures are "grand mal" and other people have described that he falls, hands and jaw lock and he jerks all over.  These last from 1 to 10 minutes.  He has lost control of bladder during episodes.  He does not think that he turns blue or has difficulty breathing.  He states that after his seizures he is always on his side on the ground.  At baseline, he states he typically has two to three seizures per day and sometimes he does not have any.  He states his seizures increased in frequency when he got a divorce last December.    Due to increased seizures, his Vimpat was increased this week.  Due to seizures, he was seen at an OSH yesterday where he was partially loaded with dilantin due to a low level and discharged home.  He was at a [employer] last night when he states he apparently had a seizure and EMS picked him up.  He remembers going into [employer], but he does not remember eating.  He is told his seizure happened after dinner and EMS was called and brought him to the ER where he reportedly had several additional seizures.  He was loaded with ativan 4mg , phenergan, and dilantin 1000mg .  Notes state that he has minimal post-ictal state.  He has hit his head in the last few days during seizures but head CT was negative.  He reports persistent double vision.  Neurology was consulted and recommended rechecking a Dilantin level and if therapeutic, starting zonegran.  MRI brain is pending.     Review of Systems:   Denies fevers, chills.  He endorses headache.  He has had sinus  allergies with mild cold symptoms.  Denies chest pain, shortness of breath, wheeze, nausea, vomiting, constipation, diarrhea, dysuria.  He states he uses cpap at home.  He denies enlarged lymph nodes, abnormal bruising or bleeding, anxiety.  Depression is well controlled.  Denies SI.    Past Medical History  Diagnosis Date  . Diverticulosis        . Hypertension   . Seizures     Event recorder May, 2013, no significant abnormalities  . Asthma   . Acid reflux   . Migraines   . Depression   . Mood swings   . Chest pain with normal coronary angiography     a.  09/2011 Cath:  Normal Cors, NL LV fxn  . Sleep apnea     a. On CPAP  . Kidney stones    Past Surgical History  Procedure Date  . Hernia repair   . Cholecystectomy   . Tonsillectomy   . Neck surgery   . Wrist fusion left  . Cardiac catheterization    Social History:  reports that he has quit smoking. He has never used smokeless tobacco. He reports that he does not drink alcohol or use illicit drugs. Lives with his parents currently.  He takes care of himself and does not work due to seizures.  He is on SSI.     Allergies  Allergen Reactions  . Bee Venom Anaphylaxis  .  Meloxicam Hives  . Penicillins Hives  . Sulfa Antibiotics Hives  . Ketorolac Tromethamine Itching  . Lortab (Hydrocodone-Acetaminophen) Hives    Family History  Problem Relation Age of Onset  . Coronary artery disease Brother     66 MI and 25 years old  . Coronary artery disease Mother   . Coronary artery disease Father   . Stroke Mother   . Diabetes Mother     type 2  . Diabetes Father     type 2   Prior to Admission medications   Medication Sig Start Date End Date Taking? Authorizing Provider  albuterol (PROVENTIL HFA;VENTOLIN HFA) 108 (90 BASE) MCG/ACT inhaler Inhale 2 puffs into the lungs every 6 (six) hours as needed. For shortness of breath   Yes Historical Provider, MD  aspirin 81 MG tablet Take 81 mg by mouth daily.   Yes Historical  Provider, MD  EPINEPHrine (EPIPEN JR) 0.15 MG/0.3ML injection Inject 0.15 mg into the muscle daily as needed. For severe allergic reaction   Yes Historical Provider, MD  Lacosamide (VIMPAT) 100 MG TABS Take 200 mg by mouth 2 (two) times daily.   Yes Historical Provider, MD  lisinopril-hydrochlorothiazide (PRINZIDE,ZESTORETIC) 20-12.5 MG per tablet Take 1 tablet by mouth daily.    Yes Historical Provider, MD  loratadine (CLARITIN) 10 MG tablet Take 10 mg by mouth daily.     Yes Historical Provider, MD  Multiple Vitamins-Minerals (MULTIVITAMIN WITH MINERALS) tablet Take 1 tablet by mouth 2 (two) times daily.    Yes Historical Provider, MD  omeprazole (PRILOSEC) 20 MG capsule Take 20 mg by mouth daily.   Yes Historical Provider, MD  phenytoin (DILANTIN) 100 MG ER capsule Take 200 mg by mouth 2 (two) times daily.    Yes Historical Provider, MD  ranitidine (ZANTAC) 150 MG tablet Take 150 mg by mouth 2 (two) times daily.   Yes Historical Provider, MD  sertraline (ZOLOFT) 100 MG tablet Take 200 mg by mouth daily.    Yes Historical Provider, MD  sucralfate (CARAFATE) 1 G tablet Take 1 g by mouth 2 (two) times daily.     Yes Historical Provider, MD  SUMAtriptan (IMITREX) 100 MG tablet Take 100 mg by mouth every 2 (two) hours as needed. For migraine   Yes Historical Provider, MD  vitamin C (ASCORBIC ACID) 500 MG tablet Take 500 mg by mouth daily.   Yes Historical Provider, MD  DILANTIN 100 MG ER capsule 200mg  in morning and 300 mg in evening. 02/27/12   Juliet Rude. Rubin Payor, MD   Physical Exam: Filed Vitals:   02/27/12 0400 02/27/12 0430 02/27/12 0500 02/27/12 0647  BP: 122/73 118/70 115/68 141/82  Pulse: 64 66 67 66  Temp:    97.9 F (36.6 C)  TempSrc:      Resp: 15 15 13 14   Height:    5\' 8"  (1.727 m)  Weight:    113 kg (249 lb 1.9 oz)  SpO2: 97% 100% 98% 100%     General:  Obese CM, no acute distress  Eyes: anicteric, mildly injected, PERRL  ENT: nares and OP nonerythematous, no plaques,  MMM  Neck: supple, no thyromegaly  Lymph:  No cervical or supraclavicular lymphadenopathy  Cardiovascular: RRR, no murmurs, rubs, or gallops  Respiratory: CTAB  Abdomen: NABS, soft, obese, nondistended, nontender  Skin: no rash or ecchymoses  Musculoskeletal: Normal tone and bulk.  No LEE  Psychiatric: A&Ox4, fell asleep a few times during exam, but easily arouseable  Neurologic: II-XII grossly intact,  5/5 strength throughout, sensation intact to light touch.    Labs on Admission:  Basic Metabolic Panel:  Lab 02/27/12 1191  NA 141  K 3.8  CL 107  CO2 24  GLUCOSE 79  BUN 12  CREATININE 0.79  CALCIUM 9.1  MG --  PHOS --   Liver Function Tests: No results found for this basename: AST:5,ALT:5,ALKPHOS:5,BILITOT:5,PROT:5,ALBUMIN:5 in the last 168 hours No results found for this basename: LIPASE:5,AMYLASE:5 in the last 168 hours No results found for this basename: AMMONIA:5 in the last 168 hours CBC:  Lab 02/27/12 0111  WBC 5.4  NEUTROABS 2.6  HGB 12.2*  HCT 35.8*  MCV 86.3  PLT 177   Cardiac Enzymes: No results found for this basename: CKTOTAL:5,CKMB:5,CKMBINDEX:5,TROPONINI:5 in the last 168 hours  BNP (last 3 results) No results found for this basename: PROBNP:3 in the last 8760 hours CBG: No results found for this basename: GLUCAP:5 in the last 168 hours  Radiological Exams on Admission: Ct Head Wo Contrast  02/26/2012  *RADIOLOGY REPORT*  Clinical Data: Multiple seizures, head injury, confusion.  CT HEAD WITHOUT CONTRAST  Technique:  Contiguous axial images were obtained from the base of the skull through the vertex without contrast.  Comparison: 01/20/2012.  Findings: Image quality is degraded by motion.  No evidence of acute infarct, acute hemorrhage, mass lesion, mass effect or hydrocephalus.  Scattered mucosal thickening in the paranasal sinuses.  Mastoid air cells are clear.  IMPRESSION:  1.  Image quality is degraded by motion. 2.  No definite acute  findings.   Original Report Authenticated By: Reyes Ivan, M.D.     Assessment/Plan Principal Problem:  *Seizures Active Problems:  Sleep apnea  Chest pain with normal coronary angiography  Hypertension  Depression  Diverticulosis  Acid reflux  Migraines  Kidney stones  Blurry vision   Seizure disorder with persistent blurry vision:  Patient has uncontrolled seizure disorder with several seizures per day despite reported compliance with medications.  -  Appreciate neurology assistance -  F/u MRI - patient has a metal screw in cervical spine -  Continue dilantin 200mg  qAM and 300mg  qPM -  Continue vimpat 200mg  BID -  Neurology likely to add zonegran today based on therapeutic phenytoin level overnight  Migraine headache, uncontrolled. -  Tylenol  -  Toradol 30mg  IV x 1 premedicated with benadryl -  IVF  -  Imitrex prn  Depression, stable, continue zoloft Hypertension, blood pressures mostly in normal range.  Continue HCTZ and lisinopril GERD:  Continue H2 blocker OSA:  Start CPAP qhs Asthma:  Continue albuterol as needed Normocytic anemia:  Mild. No need for transfusion.  Defer to PCP.  DIET:  Healthy heart ACCESS:  PIV IVF:  NS at 55ml/h PROPH:  SCDs  Code Status: Full code Family Communication: Spoke to patient Disposition Plan: Likely home after MRI and initiation of zonegran.  Time spent: 41  SHORTThea Silversmith [facility] Pager [phone]  If 7PM-7AM, please contact night-coverage [email or web address] Password [facility] 02/27/2012, 8:08 AM

## 2012-02-27 NOTE — ED Notes (Signed)
I went into room to take pt off monitor and take IV out and started to wake pt up by saying his name and gently shaking him with assistance from family. Pt didn't acknowledge that we were in room never moved or opened eyes then pt began to shake side rail and body like he was having a seizure I went and got nurse and had her come to the room.

## 2012-02-27 NOTE — Consult Note (Addendum)
Reason for Consult:Seizures Referring Physician: Rubin Payor  CC: "I see three of everything"  HPI: Chris Spencer is an 31 y.o. male that has had seizures since the age of 34.  The patient was away for a few years.  Has returned home in the past year.  Did reasonably well with his seizures with only a few occasionally until August.  They were so frequent in August that his mother reports that he spent much of that month in the hospital.  He has been some better this month but has continued to have frequent seizures.  Has had 5 in the past 24 hours.  Was seen at an outside hospital on yesterday and given a partial load of Dilantin due to a low level.  Returns today after having a seizure at a [employer].  Had three more here in the ED witnessed by family.  Noted in nursing notes that patient does not have a postictal state.  Has now had 4mg  of Ativan, Phenergan and 1000mg  of Dilantin.  He is lethargic.  Although girlfriend is not aware and mother reports this is the first she has heard of it, it seems that at some point in time prior to the administration of medication that the patient reported that he was seeing triple.  Patient did hit his head with his seizure yesterday and tonight.  Seizures are described as patient becoming agitated, usually having to get up and move around, then collapses.  Scheduled for EMU monitoring next month.   Head CT unremarkable.   Patient has had his Vimpat increased in the past week.    Past Medical History  Diagnosis Date  . Diverticulosis        . Hypertension   . Seizures     Event recorder May, 2013, no significant abnormalities  . Asthma   . Acid reflux   . Migraines   . Depression   . Mood swings   . Chest pain with normal coronary angiography     a.  09/2011 Cath:  Normal Cors, NL LV fxn  . Sleep apnea     a. On CPAP  . Kidney stones     Past Surgical History  Procedure Date  . Hernia repair   . Cholecystectomy   . Tonsillectomy   . Neck  surgery   . Wrist fusion left  . Cardiac catheterization     Family history: mother with a history of seizures secondary to head injury  Social History:  reports that he has quit smoking. He has never used smokeless tobacco. He reports that he does not drink alcohol or use illicit drugs.  Allergies  Allergen Reactions  . Bee Venom Anaphylaxis  . Meloxicam Hives  . Penicillins Hives  . Sulfa Antibiotics Hives  . Ketorolac Tromethamine Itching  . Lortab (Hydrocodone-Acetaminophen) Hives    Medications: I have reviewed the patient's current medications. Prior to Admission:  Current outpatient prescriptions:albuterol (PROVENTIL HFA;VENTOLIN HFA) 108 (90 BASE) MCG/ACT inhaler, Inhale 2 puffs into the lungs every 6 (six) hours as needed. For shortness of breath, Disp: , Rfl: ;  aspirin 81 MG tablet, Take 81 mg by mouth daily., Disp: , Rfl: ;  EPINEPHrine (EPIPEN JR) 0.15 MG/0.3ML injection, Inject 0.15 mg into the muscle daily as needed. For severe allergic reaction, Disp: , Rfl:  Lacosamide (VIMPAT) 100 MG TABS, Take 200 mg by mouth 2 (two) times daily., Disp: , Rfl: ;  lisinopril-hydrochlorothiazide (PRINZIDE,ZESTORETIC) 20-12.5 MG per tablet, Take 1 tablet by mouth daily. ,  Disp: , Rfl: ;  loratadine (CLARITIN) 10 MG tablet, Take 10 mg by mouth daily.  , Disp: , Rfl: ;  Multiple Vitamins-Minerals (MULTIVITAMIN WITH MINERALS) tablet, Take 1 tablet by mouth 2 (two) times daily. , Disp: , Rfl:  omeprazole (PRILOSEC) 20 MG capsule, Take 20 mg by mouth daily., Disp: , Rfl: ;  phenytoin (DILANTIN) 100 MG ER capsule, Take 200 mg by mouth 2 (two) times daily. , Disp: , Rfl: ;  ranitidine (ZANTAC) 150 MG tablet, Take 150 mg by mouth 2 (two) times daily., Disp: , Rfl: ;  sertraline (ZOLOFT) 100 MG tablet, Take 200 mg by mouth daily. , Disp: , Rfl: ;  sucralfate (CARAFATE) 1 G tablet, Take 1 g by mouth 2 (two) times daily.  , Disp: , Rfl:  SUMAtriptan (IMITREX) 100 MG tablet, Take 100 mg by mouth every 2  (two) hours as needed. For migraine, Disp: , Rfl: ;  vitamin C (ASCORBIC ACID) 500 MG tablet, Take 500 mg by mouth daily., Disp: , Rfl: ;  DILANTIN 100 MG ER capsule, 200mg  in morning and 300 mg in evening., Disp: 80 capsule, Rfl: 0  ROS: History obtained from the patient  General ROS: lethargy Psychological ROS: negative for - behavioral disorder, hallucinations, memory difficulties, mood swings or suicidal ideation Ophthalmic ROS: as per HPI ENT ROS: negative for - epistaxis, nasal discharge, oral lesions, sore throat, tinnitus or vertigo Allergy and Immunology ROS: negative for - hives or itchy/watery eyes Hematological and Lymphatic ROS: negative for - bleeding problems, bruising or swollen lymph nodes Endocrine ROS: negative for - galactorrhea, hair pattern changes, polydipsia/polyuria or temperature intolerance Respiratory ROS: negative for - cough, hemoptysis, shortness of breath or wheezing Cardiovascular ROS: negative for - chest pain, dyspnea on exertion, edema or irregular heartbeat Gastrointestinal ROS: negative for - abdominal pain, diarrhea, hematemesis, nausea/vomiting or stool incontinence Genito-Urinary ROS: negative for - dysuria, hematuria, incontinence or urinary frequency/urgency Musculoskeletal ROS: negative for - joint swelling or muscular weakness Neurological ROS: as noted in HPI Dermatological ROS: negative for rash and skin lesion changes  Physical Examination: Blood pressure 105/69, pulse 68, temperature 98 F (36.7 C), temperature source Oral, resp. rate 15, SpO2 99.00%.  Neurologic Examination Mental Status: Lethargic, oriented.  Speech fluent without evidence of aphasia.  Able to follow 3 step commands when wakefulness can be maintained.   Cranial Nerves: II: Discs flat bilaterally; Visual fields grossly normal, pupils equal, round, reactive to light and accommodation III,IV, VI: ptosis not present, extra-ocular motions intact bilaterally V,VII: smile  symmetric, facial light touch sensation normal bilaterally VIII: hearing normal bilaterally IX,X: gag reflex present XI: bilateral shoulder shrug XII: midline tongue extension Motor: Right : Upper extremity   5/5    Left:     Upper extremity   5/5  Lower extremity   5/5     Lower extremity   5/5 Tone and bulk:normal tone throughout; no atrophy noted Sensory: Pinprick and light touch intact throughout, bilaterally Deep Tendon Reflexes: 1+ and symmetric with absent AJ's bilaterally Plantars: Right: mute   Left: mute Cerebellar: normal finger-to-nose and normal heel-to-shin test Gait: unable to test CV: pulses palpable throughout     Laboratory Studies:   Basic Metabolic Panel: No results found for this basename: NA:5,K:5,CL:5,CO2:5,GLUCOSE:5,BUN:5,CREATININE:5,CALCIUM:3,MG:5,PHOS:5 in the last 168 hours  Liver Function Tests: No results found for this basename: AST:5,ALT:5,ALKPHOS:5,BILITOT:5,PROT:5,ALBUMIN:5 in the last 168 hours No results found for this basename: LIPASE:5,AMYLASE:5 in the last 168 hours No results found for this basename: AMMONIA:3 in  the last 168 hours  CBC: No results found for this basename: WBC:5,NEUTROABS:5,HGB:5,HCT:5,MCV:5,PLT:5 in the last 168 hours  Cardiac Enzymes: No results found for this basename: CKTOTAL:5,CKMB:5,CKMBINDEX:5,TROPONINI:5 in the last 168 hours  BNP: No components found with this basename: POCBNP:5  CBG: No results found for this basename: GLUCAP:5 in the last 168 hours  Microbiology: No results found for this or any previous visit.  Coagulation Studies: No results found for this basename: LABPROT:5,INR:5 in the last 72 hours  Urinalysis: No results found for this basename: COLORURINE:2,APPERANCEUR:2,LABSPEC:2,PHURINE:2,GLUCOSEU:2,HGBUR:2,BILIRUBINUR:2,KETONESUR:2,PROTEINUR:2,UROBILINOGEN:2,NITRITE:2,LEUKOCYTESUR:2 in the last 168 hours  Lipid Panel:  No results found for this basename: chol, trig, hdl, cholhdl, vldl,  ldlcalc    HgbA1C:  No results found for this basename: HGBA1C    Urine Drug Screen:   No results found for this basename: labopia, cocainscrnur, labbenz, amphetmu, thcu, labbarb    Alcohol Level: No results found for this basename: ETH:2 in the last 168 hours  Imaging: Ct Head Wo Contrast  02/26/2012  *RADIOLOGY REPORT*  Clinical Data: Multiple seizures, head injury, confusion.  CT HEAD WITHOUT CONTRAST  Technique:  Contiguous axial images were obtained from the base of the skull through the vertex without contrast.  Comparison: 01/20/2012.  Findings: Image quality is degraded by motion.  No evidence of acute infarct, acute hemorrhage, mass lesion, mass effect or hydrocephalus.  Scattered mucosal thickening in the paranasal sinuses.  Mastoid air cells are clear.  IMPRESSION:  1.  Image quality is degraded by motion. 2.  No definite acute findings.   Original Report Authenticated By: Reyes Ivan, M.D.      Assessment/Plan:  31 yo male with long history of seizures on Vimpat and Dilantin.  Dilantin level on presentation 5.9 despite augmentation of dose on yesterday.  Patient given additional medication this evening.  Current complaint of seeing triple seems to have predated his presentation this evening.  It may very well be related to his Vimpat increase and exacerbated with his current lethargy.  This was discussed with the patient's family.  They wish to take the patient home despite his lethargy and vision complaints.     Recommendations: 1.  Increase Dilantin to 200mg  in the morning and 300mg  at night 2.  With the increase in seizures recently will not decrease Vimpat at this time even though it may be contributing to his symptoms.    Patient to contact PCP tomorrow if symptoms persist.  By that time an increased level of Dilantin should be present in the patient's system and he may be able to tolerate a decrease in Vimpat better without an increase in breakthrough seizures.   3.   Patient unable to drive, operate heavy machinery, perform activities at heights and participate in water activities until release by outpatient physician.  Case discussed with Dr. Demetrius Charity, MD [facility] [phone] 02/27/2012, 12:16 AM   Addendum: Patient with another seizure noted in ED.  Will need overnight observation.  1.  Check stat Dilantin level 2.  If level therapeutic would start Zonegran at 100 mg at night.  Has been on Lamictal, Keppra, Tegretol and Phenobarbital in the past that we know of.   May have possibly been on some other medications as well but records not available.   3.  MRI of the brain  Thana Farr, MD [facility] [phone]

## 2012-02-27 NOTE — ED Notes (Signed)
Mandi NT attempted to wake pt for d/c home, pt began having seizure like activity while holding on to the bed rail, activity lasting 30 sec. No full body movement noted, HR remained NSR of 86, rr 15, which was same reading prior to activity, no loss of bowel or bladder, injury to tongue, or drooling noted.  EDP Pickering aware, waiting for Dr. Thad Ranger attending neurologist to determine if pt can be d/c'd home

## 2012-02-27 NOTE — ED Notes (Signed)
Charge Chrisandra Netters RN aware of situation

## 2012-02-28 ENCOUNTER — Observation Stay (HOSPITAL_COMMUNITY): Payer: Medicaid Other

## 2012-02-28 LAB — COMPREHENSIVE METABOLIC PANEL
Alkaline Phosphatase: 79 U/L (ref 39–117)
BUN: 17 mg/dL (ref 6–23)
Creatinine, Ser: 0.86 mg/dL (ref 0.50–1.35)
GFR calc Af Amer: >90 mL/min (ref >90)
Glucose, Bld: 90 mg/dL (ref 70–99)
Potassium: 3.7 mEq/L (ref 3.5–5.1)
Total Bilirubin: 0.1 mg/dL — ABNORMAL LOW (ref 0.3–1.2)
Total Protein: 6.1 g/dL (ref 6.0–8.3)

## 2012-02-28 MED ORDER — QUETIAPINE FUMARATE ER 50 MG PO TB24
50.0000 mg | ORAL_TABLET | Freq: Every day | ORAL | Status: DC
  Filled 2012-02-28 (×2): qty 1

## 2012-02-28 MED ORDER — IBUPROFEN 200 MG PO TABS: 800.0000 mg | ORAL_TABLET | Freq: Three times a day (TID) | ORAL | Status: DC | PRN

## 2012-02-28 MED ORDER — HYDROMORPHONE HCL 2 MG PO TABS: 1.0000 mg | ORAL_TABLET | ORAL | Status: DC | PRN

## 2012-02-28 MED ORDER — LACOSAMIDE 50 MG PO TABS: 100.0000 mg | ORAL_TABLET | Freq: Two times a day (BID) | ORAL | Status: DC

## 2012-02-28 MED ORDER — HYDROMORPHONE HCL 2 MG PO TABS
1.0000 mg | ORAL_TABLET | ORAL | Status: DC | PRN
  Administered 2012-02-28: 1 mg via ORAL
  Filled 2012-02-28: qty 1

## 2012-02-28 MED ORDER — QUETIAPINE FUMARATE ER 50 MG PO TB24: 50.0000 mg | ORAL_TABLET | Freq: Every day | ORAL | Status: DC

## 2012-02-28 MED ORDER — BACLOFEN 5 MG HALF TABLET: 5.0000 mg | ORAL_TABLET | Freq: Three times a day (TID) | ORAL | Status: DC

## 2012-02-28 MED ORDER — BACLOFEN 5 MG HALF TABLET
5.0000 mg | ORAL_TABLET | Freq: Three times a day (TID) | ORAL | Status: DC
  Administered 2012-02-28: 5 mg via ORAL
  Filled 2012-02-28 (×3): qty 1

## 2012-02-28 NOTE — Progress Notes (Addendum)
Subjective:  Patient with seizure disorder,  Stable on dilantin. Patient seen this morning at the bedside, his gf - Marchelle Folks is in the room. Patient is on CPAP for sleep apnea. Patient has significant temper and personality disorder. He has been treated with SSRI, which may have tendency to decrease seizure threshold. Patient is scheduled to go to [facility] on  03/12/2012  - EMU Objective: Current vital signs: BP 114/72  Pulse 67  Temp 97.8 F (36.6 C) (Oral)  Resp 18  Ht 5\' 8"  (1.727 m)  Wt 112 kg (246 lb 14.6 oz)  BMI 37.54 kg/m2  SpO2 99% Vital signs in last 24 hours: Temp:  [97.8 F (36.6 C)-98 F (36.7 C)] 97.8 F (36.6 C) (09/29 0541) Pulse Rate:  [58-72] 67  (09/29 0541) Resp:  [16-18] 18  (09/29 0541) BP: (109-126)/(49-76) 114/72 mmHg (09/29 0541) SpO2:  [99 %-100 %] 99 % (09/29 0541) Weight:  [112 kg (246 lb 14.6 oz)] 112 kg (246 lb 14.6 oz) (09/29 0541)  Intake/Output from previous day: 09/28 0701 - 09/29 0700 In: 363.8 [I.V.:363.8] Out: 650 [Urine:650] Intake/Output this shift:   Nutritional status: Cardiac  Neurologic Exam: Mental Status: Alert, oriented, thought content appropriate.  Speech fluent without evidence of aphasia.  Able to follow 3 step commands without difficulty. Cranial Nerves: II: visual fields grossly normal, pupils equal, round, reactive to light and accommodation III,IV, VI: ptosis not present, extra-ocular motions intact bilaterally V,VII: smile symmetric, facial light touch sensation normal bilaterally VIII: hearing normal bilaterally IX,X: gag reflex present XI: trapezius strength/neck flexion strength normal bilaterally XII: tongue strength normal  Motor: Right : Upper extremity   5/5    Left:     Upper extremity   5/5  Lower extremity   5/5     Lower extremity   5/5 Tone and bulk:normal tone throughout; no atrophy noted Sensory: Pinprick and light touch intact throughout, bilaterally Deep Tendon Reflexes: 2+ and symmetric  throughout Plantars: Right: downgoing   Left: downgoing Cerebellar: normal finger-to-nose,  Lab Results: Results for orders placed during the hospital encounter of 02/26/12 (from the past 48 hour(s))  PHENYTOIN LEVEL, TOTAL     Status: Abnormal   Collection Time   02/26/12  6:21 PM      Component Value Range Comment   Phenytoin Lvl 5.9 (*) 10.0 - 20.0 ug/mL   PHENYTOIN LEVEL, TOTAL     Status: Normal   Collection Time   02/27/12  1:11 AM      Component Value Range Comment   Phenytoin Lvl 12.6  10.0 - 20.0 ug/mL   BASIC METABOLIC PANEL     Status: Normal   Collection Time   02/27/12  1:11 AM      Component Value Range Comment   Sodium 141  135 - 145 mEq/L    Potassium 3.8  3.5 - 5.1 mEq/L    Chloride 107  96 - 112 mEq/L    CO2 24  19 - 32 mEq/L    Glucose, Bld 79  70 - 99 mg/dL    BUN 12  6 - 23 mg/dL    Creatinine, Ser 4.54  0.50 - 1.35 mg/dL    Calcium 9.1  8.4 - 09.8 mg/dL    GFR calc non Af Amer >90  >90 mL/min    GFR calc Af Amer >90  >90 mL/min   CBC WITH DIFFERENTIAL     Status: Abnormal   Collection Time   02/27/12  1:11 AM  Component Value Range Comment   WBC 5.4  4.0 - 10.5 K/uL    RBC 4.15 (*) 4.22 - 5.81 MIL/uL    Hemoglobin 12.2 (*) 13.0 - 17.0 g/dL    HCT 16.1 (*) 09.6 - 52.0 %    MCV 86.3  78.0 - 100.0 fL    MCH 29.4  26.0 - 34.0 pg    MCHC 34.1  30.0 - 36.0 g/dL    RDW 04.5  40.9 - 81.1 %    Platelets 177  150 - 400 K/uL    Neutrophils Relative 49  43 - 77 %    Neutro Abs 2.6  1.7 - 7.7 K/uL    Lymphocytes Relative 36  12 - 46 %    Lymphs Abs 2.0  0.7 - 4.0 K/uL    Monocytes Relative 9  3 - 12 %    Monocytes Absolute 0.5  0.1 - 1.0 K/uL    Eosinophils Relative 6 (*) 0 - 5 %    Eosinophils Absolute 0.3  0.0 - 0.7 K/uL    Basophils Relative 1  0 - 1 %    Basophils Absolute 0.0  0.0 - 0.1 K/uL   PHENYTOIN LEVEL, TOTAL     Status: Normal   Collection Time   02/27/12  9:15 AM      Component Value Range Comment   Phenytoin Lvl 12.3  10.0 - 20.0 ug/mL    URINE RAPID DRUG SCREEN (HOSP PERFORMED)     Status: Normal   Collection Time   02/27/12 10:23 AM      Component Value Range Comment   Opiates NONE DETECTED  NONE DETECTED    Cocaine NONE DETECTED  NONE DETECTED    Benzodiazepines NONE DETECTED  NONE DETECTED    Amphetamines NONE DETECTED  NONE DETECTED    Tetrahydrocannabinol NONE DETECTED  NONE DETECTED    Barbiturates NONE DETECTED  NONE DETECTED   COMPREHENSIVE METABOLIC PANEL     Status: Abnormal   Collection Time   02/28/12  5:20 AM      Component Value Range Comment   Sodium 140  135 - 145 mEq/L    Potassium 3.7  3.5 - 5.1 mEq/L    Chloride 105  96 - 112 mEq/L    CO2 25  19 - 32 mEq/L    Glucose, Bld 90  70 - 99 mg/dL    BUN 17  6 - 23 mg/dL    Creatinine, Ser 9.14  0.50 - 1.35 mg/dL    Calcium 9.0  8.4 - 78.2 mg/dL    Total Protein 6.1  6.0 - 8.3 g/dL    Albumin 3.5  3.5 - 5.2 g/dL    AST 24  0 - 37 U/L    ALT 44  0 - 53 U/L    Alkaline Phosphatase 79  39 - 117 U/L    Total Bilirubin 0.1 (*) 0.3 - 1.2 mg/dL    GFR calc non Af Amer >90  >90 mL/min    GFR calc Af Amer >90  >90 mL/min     No results found for this or any previous visit (from the past 240 hour(s)).  Lipid Panel No results found for this basename: CHOL,TRIG,HDL,CHOLHDL,VLDL,LDLCALC in the last 72 hours  Studies/Results: Ct Head Wo Contrast  02/26/2012  *RADIOLOGY REPORT*  Clinical Data: Multiple seizures, head injury, confusion.  CT HEAD WITHOUT CONTRAST  Technique:  Contiguous axial images were obtained from the base of the skull through the vertex  without contrast.  Comparison: 01/20/2012.  Findings: Image quality is degraded by motion.  No evidence of acute infarct, acute hemorrhage, mass lesion, mass effect or hydrocephalus.  Scattered mucosal thickening in the paranasal sinuses.  Mastoid air cells are clear.  IMPRESSION:  1.  Image quality is degraded by motion. 2.  No definite acute findings.   Original Report Authenticated By: Reyes Ivan, M.D.     Mr Brain Wo Contrast  02/27/2012  *RADIOLOGY REPORT*  Clinical Data: Increasing seizure activity.  History of fall several years ago.  MRI HEAD WITHOUT CONTRAST  Technique:  Multiplanar, multiecho pulse sequences of the brain and surrounding structures were obtained according to standard protocol without intravenous contrast.  Comparison: Multiple prior CT head studies. Prior MR Brain 2007.  Findings: There is no evidence for acute infarction, intracranial hemorrhage, mass lesion, hydrocephalus, or extra-axial fluid. There is no generalized atrophy or white matter disease.  There are no foci of chronic hemorrhage.  Thin section heavily T2-weighted images demonstrate asymmetric prominence of the right temporal horn as compared to the left. The right choroidal fissure is also increased in size relative to the normal left.  I believe this is due to asymmetric loss of hippocampal volume on the right.  The right fornix and right mamillary body are slightly decreased in size compared to the left. 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 A similar appearance was present on prior MR 01/27/2006.  Major intracranial vascular structures widely patent.  Normal pituitary and cerebellar tonsils.  Upper cervical region unremarkable status post previous surgery in the C2 region.  No acute sinus or mastoid disease.  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 This is likely the etiology of the patient's seizure disorder.  No acute or focal intracranial abnormality.   Original Report Authenticated By: Elsie Stain, M.D.     Medications:  Scheduled:   . aspirin  81 mg Oral Daily  . diphenhydrAMINE  12.5 mg Intravenous Once  . famotidine  20 mg Oral Daily  . lisinopril  20 mg Oral Daily   And  . hydrochlorothiazide  12.5 mg Oral Daily  .  HYDROmorphone (DILAUDID) injection  0.5 mg  Intravenous Once  . ketorolac  30 mg Intravenous Once  . lacosamide  200 mg Oral BID  . loratadine  10 mg Oral Daily  . multivitamin with minerals  1 tablet Oral BID  . pantoprazole  40 mg Oral Q1200  . phenytoin  200 mg Oral Daily  . phenytoin  300 mg Oral QHS  . sertraline  200 mg Oral Daily  . sodium chloride  3 mL Intravenous Q12H  . vitamin C  500 mg Oral Daily       Assessment/Plan:   Patient with seizure disorder in addition to  Temper and mood disorder.   Needs EMU to rule out non epileptic seizures ( psychogenic). He has been scheduled to go to [place] at [facility] in October.MRI of the brain  Abnormal MRI which indicates that the patient has a seizure focus.  However, he needs to be on dilantin and he agrees to take them regularly, however, dilantin made by different generic companies varies in bioavailability and that may be the reason he has decreased level. He has to request the pharmacist to dispense the same generic dilantin all the time I will also add Vimpat  100 mg po bid  Recommendations: 1) Advised  patient not to drive or operate heavy machinery, do not work or work related activity at elevation, no water related activity, no baby sitting.   2) Continue with 200 mg po f Dilantin  In the AM and 300 mg of Dilantin QHS 3) Vimpat 200 mg po bid 4) Follow up with Outpatient neurologist 5) Neurosurgery consult  As an outpatient 6) Highly recommend to take off Sertraline which has high risk for seizures and start low dose of Seroquel  For now. And follow up with Psych     Anastyn Ayars V-P Eilleen Kempf., MD., Ph.D.,MS 02/28/2012 9:29 AM

## 2012-02-28 NOTE — Discharge Summary (Addendum)
Physician Discharge Summary  Chris Spencer WJX:[id number] DOB: 1981/01/19 DOA: 02/26/2012  PCP: Toma Deiters, MD  Admit date: 02/26/2012 Discharge date: 02/28/2012  Recommendations for Outpatient Follow-up:  1. Follow up with Neurology to review results of MRI finding of mesial temporal sclerosis.   2. Follow up with primary care for management of neck pain, migraine headaches, mild anemia within one week.  Also please make sure his cpap machine is working properly and does not need adjustments.   3. Follow up with psychiatry for management of mood disorder, particularly given risk of medication interactions and recent medication change.    Discharge Diagnoses:  Principal Problem:  *Seizures Active Problems:  Sleep apnea  Chest pain with normal coronary angiography  Hypertension  Depression  Diverticulosis  Acid reflux  Migraines  Kidney stones  Blurry vision  Mesial temporal sclerosis   Discharge Condition: stable, improved  Diet recommendation:  Healthy heart  Wt Readings from Last 3 Encounters:  02/28/12 112 kg (246 lb 14.6 oz)  12/16/11 113.399 kg (250 lb)  11/15/11 111.131 kg (245 lb)    History of present illness:   The patient is a 31 year old male with history of migraines, HTN, asthma, GERD, seizure disorder, sleep apnea who presents with seizures. The patient has a history of seizures since he was an adolescent. His typical seizures are "grand mal" and other people have described that he falls, hands and jaw lock and he jerks all over. These last from 1 to 10 minutes. He has lost control of bladder during episodes. He does not think that he turns blue or has difficulty breathing. He states that after his seizures he is always on his side on the ground. At baseline, he states he typically has two to three seizures per day and sometimes he does not have any. He states his seizures increased in frequency when he got a divorce last December.   Due to increased seizures,  his Vimpat was increased this week. Due to seizures, he was seen at an OSH yesterday where he was partially loaded with dilantin due to a low level and discharged home. He was at a [employer] last night when he states he apparently had a seizure and EMS picked him up. He remembers going into [employer], but he does not remember eating. He is told his seizure happened after dinner and EMS was called and brought him to the ER where he reportedly had several additional seizures. He was loaded with ativan 4mg , phenergan, and dilantin 1000mg . Notes state that he has minimal post-ictal state. He has hit his head in the last few days during seizures but head CT was negative. He reports persistent double vision. Neurology was consulted and recommended rechecking a Dilantin level and if therapeutic, starting zonegran. MRI brain is pending.   Hospital Course:   [name] was hospitalized with seizure disorder and he underwent MRI of the brain which demonstrated focal right hippocampal atrophy and prominence of the temporal horn/choroidal fissure consistent with mesial temporal sclerosis which appears stable from MRI in 2007 per report.  This was felt to be the underlying cause of the patient's seizure disorder and would explain the refractory nature of the seizures.  The patient was seen by Neurology who recommended repeated phenytoin levels, three of which were between 10 and 15.  He was continued on vimpat 200mg  bid and phenytoin 200mg  qAM and 300mg  qPM.  He should follow up with his primary Neurologist Dr. Lissa Hoard within 1 week to  discuss the findings on the MRI and future management.  The patient is allergic to sulfa and therefore zonegran could not be added.  Also zoloft may be interfering with the patient's phenytoin metabolism, so per neurology recommendations, this was discontinued and the patient was started on seroquel.  The patient was amenable to this change.    Mr. Carreno developed a frontal  headache similar to previous migraines on 9/27 which responded to toradol.  He then developed a right posterior neck pain and headache which responded to IV dilaudid.  The patient specifically requested narcotics to treat the headache.  X-rays were obtained of the cervical spine which were negative for fracture and he was given prescriptions for high dose ibuprofen, oral dilaudid and baclofen for likely MSK-related headache.  He should follow up with his primary care doctor for further management of his neck pain and migraine headaches.    His blood pressure was well controlled on his current medications which were continued.  He also continued his H2 blocker and his family brought his CPAP machine from home.  He had a mild normocytic anemia for which he should see his primary care doctor for further evaluation if not already completed.    Epistaxis on the day of discharge was likely due to the dry air from nasal canula and NSAID use.  Patient advised to hold cold compress to nose with constant pressure.    Procedures:  CT head  MRI brain  Cervical spine X-ray  Consultations:  Neurology  Discharge Exam: Filed Vitals:   02/28/12 1435  BP: 129/75  Pulse: 72  Temp: 98 F (36.7 C)  Resp: 18   Filed Vitals:   02/27/12 1457 02/27/12 2217 02/28/12 0541 02/28/12 1435  BP: 109/72 126/76 114/72 129/75  Pulse: 58 72 67 72  Temp: 97.9 F (36.6 C) 98 F (36.7 C) 97.8 F (36.6 C) 98 F (36.7 C)  TempSrc: Oral Oral Oral Oral  Resp: 16 16 18 18   Height:      Weight:   112 kg (246 lb 14.6 oz)   SpO2: 99% 100% 99% 100%    General: Obese CM, no acute distress  Eyes: anicteric, mildly injected, PERRL  ENT: nares and OP nonerythematous, no plaques, MMM.  Mild nose bleed. Neck: supple, no thyromegaly  Lymph: No cervical or supraclavicular lymphadenopathy  Cardiovascular: RRR, no murmurs, rubs, or gallops  Respiratory: CTAB  Abdomen: NABS, soft, obese, nondistended, nontender  Skin: no  rash or ecchymoses  Musculoskeletal: Normal tone and bulk. No LEE.  Mild muscle spasms of the right cervical paraspinous muscles. Psychiatric: A&Ox4, fell asleep a few times during exam, but easily arouseable  Neurologic: II-XII grossly intact, 5/5 strength throughout, sensation intact to light touch.   Discharge Instructions      Discharge Orders    Future Appointments: Provider: Department: Dept Phone: Center:   03/08/2012 8:45 AM Luis Abed, MD [facility] [phone] [facility]     Future Orders Please Complete By Expires   Diet - low sodium heart healthy      Increase activity slowly      Discharge instructions      Comments:   You were hospitalized with seizures and were found to have mesial temporal sclerosis on your MRI, which is a congenital condition that can predispose you to seizures that are hard to treat with medications.  Please take Brand Dilantin 200mg  in the morning and 300mg  at night.  Your dilantin levels were 12 while in the  hospital.  You should continue vimpat also.  Zoloft can interfere with dilantin, so please STOP zoloft and try seroquel instead.  It is usually well-tolerated and the dose can be increased if needed.  Your migraine improved with toradol.  Your neck pain should improve with pain medication and muscle relaxants.  You did not have a cervical spine injury.  Please follow up with your primary care doctor within 1 week to discuss your headaches and your seroquel.  Finally, if you were taking carafate, please stop because this can interfere with the absorption of your medications.   Call MD for:      Comments:   Call 911 if you have chest pain, shortness of breath, facial droop, confusion, slurred speech, numbness or weakness of arm or leg.   Call MD for:  temperature >100.4      Call MD for:  persistant nausea and vomiting      Call MD for:  severe uncontrolled pain      Call MD for:  difficulty breathing, headache or visual disturbances       Call MD for:  hives      Call MD for:  persistant dizziness or light-headedness      Call MD for:  extreme fatigue      Driving Restrictions      Comments:   No climbing, swimming, bathing, motorized sports activities.  Please do not attempt activities where you could fall or drown or hurt yourself if you seized.       Medication List     As of 02/28/2012  2:56 PM    STOP taking these medications         sertraline 100 MG tablet   Commonly known as: ZOLOFT      sucralfate 1 G tablet   Commonly known as: CARAFATE      SUMAtriptan 100 MG tablet   Commonly known as: IMITREX      TAKE these medications         albuterol 108 (90 BASE) MCG/ACT inhaler   Commonly known as: PROVENTIL HFA;VENTOLIN HFA   Inhale 2 puffs into the lungs every 6 (six) hours as needed. For shortness of breath      aspirin 81 MG tablet   Take 81 mg by mouth daily.      baclofen 5 mg Tabs   Commonly known as: LIORESAL   Take 0.5 tablets (5 mg total) by mouth 3 (three) times daily.      DILANTIN 100 MG ER capsule   Generic drug: phenytoin   200mg  in morning and 300 mg in evening.      phenytoin 100 MG ER capsule   Commonly known as: DILANTIN   Take 200 mg by mouth 2 (two) times daily.      EPINEPHrine 0.15 MG/0.3ML injection   Commonly known as: EPIPEN JR   Inject 0.15 mg into the muscle daily as needed. For severe allergic reaction      HYDROmorphone 2 MG tablet   Commonly known as: DILAUDID   Take 0.5 tablets (1 mg total) by mouth every 4 (four) hours as needed (neck pain).      ibuprofen 200 MG tablet   Commonly known as: ADVIL,MOTRIN   Take 4 tablets (800 mg total) by mouth every 8 (eight) hours as needed for pain.      lisinopril-hydrochlorothiazide 20-12.5 MG per tablet   Commonly known as: PRINZIDE,ZESTORETIC   Take 1 tablet by mouth daily.  loratadine 10 MG tablet   Commonly known as: CLARITIN   Take 10 mg by mouth daily.      multivitamin with minerals tablet   Take 1  tablet by mouth 2 (two) times daily.      omeprazole 20 MG capsule   Commonly known as: PRILOSEC   Take 20 mg by mouth daily.      QUEtiapine 50 MG Tb24   Commonly known as: SEROQUEL XR   Take 1 tablet (50 mg total) by mouth daily.      ranitidine 150 MG tablet   Commonly known as: ZANTAC   Take 150 mg by mouth 2 (two) times daily.      VIMPAT 100 MG Tabs   Generic drug: Lacosamide   Take 200 mg by mouth 2 (two) times daily.      vitamin C 500 MG tablet   Commonly known as: ASCORBIC ACID   Take 500 mg by mouth daily.      ZOLMitriptan 2.5 MG tablet   Commonly known as: ZOMIG   Take 2.5 mg by mouth as needed.        Follow-up Information    Follow up with HASANAJ,XAJE A, MD. Schedule an appointment as soon as possible for a visit in 3 days.   Contact information:   Please schedule an appointment to follow up on your seroquel dose and whether it may need to be adjusted.  Please discuss your headaches also.        Follow up with Dr. Lissa Hoard.   Contact information:   Please follow up with your neurologist within one week to discuss your recent admission and the findings on your MRI.            The results of significant diagnostics from this hospitalization (including imaging, microbiology, ancillary and laboratory) are listed below for reference.    Significant Diagnostic Studies: Ct Head Wo Contrast  02/26/2012  *RADIOLOGY REPORT*  Clinical Data: Multiple seizures, head injury, confusion.  CT HEAD WITHOUT CONTRAST  Technique:  Contiguous axial images were obtained from the base of the skull through the vertex without contrast.  Comparison: 01/20/2012.  Findings: Image quality is degraded by motion.  No evidence of acute infarct, acute hemorrhage, mass lesion, mass effect or hydrocephalus.  Scattered mucosal thickening in the paranasal sinuses.  Mastoid air cells are clear.  IMPRESSION:  1.  Image quality is degraded by motion. 2.  No definite acute findings.   Original  Report Authenticated By: Reyes Ivan, M.D.    Mr Brain Wo Contrast  02/27/2012  *RADIOLOGY REPORT*  Clinical Data: Increasing seizure activity.  History of fall several years ago.  MRI HEAD WITHOUT CONTRAST  Technique:  Multiplanar, multiecho pulse sequences of the brain and surrounding structures were obtained according to standard protocol without intravenous contrast.  Comparison: Multiple prior CT head studies. Prior MR Brain 2007.  Findings: There is no evidence for acute infarction, intracranial hemorrhage, mass lesion, hydrocephalus, or extra-axial fluid. There is no generalized atrophy or white matter disease.  There are no foci of chronic hemorrhage.  Thin section heavily T2-weighted images demonstrate asymmetric prominence of the right temporal horn as compared to the left. The right choroidal fissure is also increased in size relative to the normal left.  I believe this is due to asymmetric loss of hippocampal volume on the right.  The right fornix and right mamillary body are slightly decreased in size compared to the left. Findings are consistent with mesial  temporal sclerosis, a chronic abnormality. A similar appearance was present on prior MR 01/27/2006.  Major intracranial vascular structures widely patent.  Normal pituitary and cerebellar tonsils.  Upper cervical region unremarkable status post previous surgery in the C2 region.  No acute sinus or mastoid disease.  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 This is likely the etiology of the patient's seizure disorder.  No acute or focal intracranial abnormality.   Original Report Authenticated By: Elsie Stain, M.D.     Microbiology: No results found for this or any previous visit (from the past 240 hour(s)).   Labs: Basic Metabolic Panel:  Lab 02/28/12 7829 02/27/12 0111  NA 140 141  K 3.7 3.8  CL 105 107  CO2 25 24  GLUCOSE  90 79  BUN 17 12  CREATININE 0.86 0.79  CALCIUM 9.0 9.1  MG -- --  PHOS -- --   Liver Function Tests:  Lab 02/28/12 0520  AST 24  ALT 44  ALKPHOS 79  BILITOT 0.1*  PROT 6.1  ALBUMIN 3.5   No results found for this basename: LIPASE:5,AMYLASE:5 in the last 168 hours No results found for this basename: AMMONIA:5 in the last 168 hours CBC:  Lab 02/27/12 0111  WBC 5.4  NEUTROABS 2.6  HGB 12.2*  HCT 35.8*  MCV 86.3  PLT 177   Cardiac Enzymes: No results found for this basename: CKTOTAL:5,CKMB:5,CKMBINDEX:5,TROPONINI:5 in the last 168 hours BNP: BNP (last 3 results) No results found for this basename: PROBNP:3 in the last 8760 hours CBG: No results found for this basename: GLUCAP:5 in the last 168 hours  Time coordinating discharge: 60 minutes  Signed:  Shabrea Weldin  [facility] 02/28/2012, 2:56 PM

## 2012-03-01 ENCOUNTER — Emergency Department (HOSPITAL_COMMUNITY)
Admission: EM | Admit: 2012-03-01 | Discharge: 2012-03-02 | Disposition: A | Discharge status: Home / Self Care | Payer: Medicaid Other | Attending: Emergency Medicine | Admitting: Emergency Medicine

## 2012-03-01 ENCOUNTER — Emergency Department (HOSPITAL_COMMUNITY): Payer: Medicaid Other

## 2012-03-01 DIAGNOSIS — R200 Anesthesia of skin: Secondary | ICD-10-CM

## 2012-03-01 DIAGNOSIS — F329 Major depressive disorder, single episode, unspecified: Secondary | ICD-10-CM | POA: Insufficient documentation

## 2012-03-01 DIAGNOSIS — Z79899 Other long term (current) drug therapy: Secondary | ICD-10-CM | POA: Insufficient documentation

## 2012-03-01 DIAGNOSIS — F3289 Other specified depressive episodes: Secondary | ICD-10-CM | POA: Insufficient documentation

## 2012-03-01 DIAGNOSIS — J45909 Unspecified asthma, uncomplicated: Secondary | ICD-10-CM | POA: Insufficient documentation

## 2012-03-01 DIAGNOSIS — K219 Gastro-esophageal reflux disease without esophagitis: Secondary | ICD-10-CM | POA: Insufficient documentation

## 2012-03-01 DIAGNOSIS — R51 Headache: Secondary | ICD-10-CM | POA: Insufficient documentation

## 2012-03-01 DIAGNOSIS — R209 Unspecified disturbances of skin sensation: Secondary | ICD-10-CM | POA: Insufficient documentation

## 2012-03-01 DIAGNOSIS — R4182 Altered mental status, unspecified: Secondary | ICD-10-CM

## 2012-03-01 DIAGNOSIS — R202 Paresthesia of skin: Secondary | ICD-10-CM

## 2012-03-01 DIAGNOSIS — H538 Other visual disturbances: Secondary | ICD-10-CM | POA: Insufficient documentation

## 2012-03-01 LAB — COMPREHENSIVE METABOLIC PANEL
Alkaline Phosphatase: 99 U/L (ref 39–117)
BUN: 10 mg/dL (ref 6–23)
CO2: 26 mEq/L (ref 19–32)
Chloride: 101 mEq/L (ref 96–112)
GFR calc Af Amer: >90 mL/min (ref >90)
GFR calc non Af Amer: >90 mL/min (ref >90)
Glucose, Bld: 83 mg/dL (ref 70–99)
Potassium: 3.8 mEq/L (ref 3.5–5.1)
Total Bilirubin: 0.2 mg/dL — ABNORMAL LOW (ref 0.3–1.2)
Total Protein: 6.9 g/dL (ref 6.0–8.3)

## 2012-03-01 LAB — CBC WITH DIFFERENTIAL/PLATELET
Eosinophils Absolute: 0.4 10*3/uL (ref 0.0–0.7)
Hemoglobin: 13.1 g/dL (ref 13.0–17.0)
Lymphocytes Relative: 32 % (ref 12–46)
Lymphs Abs: 2 10*3/uL (ref 0.7–4.0)
MCH: 30 pg (ref 26.0–34.0)
MCV: 85.3 fL (ref 78.0–100.0)
Monocytes Relative: 9 % (ref 3–12)
Neutrophils Relative %: 52 % (ref 43–77)
RBC: 4.36 MIL/uL (ref 4.22–5.81)

## 2012-03-01 MED ORDER — SODIUM CHLORIDE 0.9 % IV SOLN
INTRAVENOUS | Status: DC
  Administered 2012-03-01: 22:00:00 via INTRAVENOUS

## 2012-03-01 MED ORDER — ONDANSETRON HCL 4 MG/2ML IJ SOLN
4.0000 mg | Freq: Once | INTRAMUSCULAR | Status: AC
  Administered 2012-03-01: 4 mg via INTRAVENOUS
  Filled 2012-03-01: qty 2

## 2012-03-01 MED ORDER — METOCLOPRAMIDE HCL 5 MG/ML IJ SOLN
10.0000 mg | Freq: Once | INTRAMUSCULAR | Status: AC
  Administered 2012-03-01: 10 mg via INTRAVENOUS
  Filled 2012-03-01: qty 2

## 2012-03-01 MED ORDER — DEXAMETHASONE SODIUM PHOSPHATE 10 MG/ML IJ SOLN
10.0000 mg | Freq: Once | INTRAMUSCULAR | Status: AC
  Administered 2012-03-02: 10 mg via INTRAVENOUS
  Filled 2012-03-01 (×2): qty 1

## 2012-03-01 MED ORDER — DIPHENHYDRAMINE HCL 50 MG/ML IJ SOLN
25.0000 mg | Freq: Once | INTRAMUSCULAR | Status: AC
  Administered 2012-03-01: 25 mg via INTRAVENOUS
  Filled 2012-03-01: qty 1

## 2012-03-01 NOTE — ED Provider Notes (Signed)
History     CSN: [id number]  Arrival date & time 03/01/12  1907   First MD Initiated Contact with Patient 03/01/12 2011      Chief Complaint  Patient presents with  . Seizures    (Consider location/radiation/quality/duration/timing/severity/associated sxs/prior treatment) HPI  Family reports patient has been following all day. Mother relates about 4:15 he seemed to be quality. He contacted his girlfriend at 30 that his left side was numb and he was having a left-sided headache that he states is a "smashing" pain. He states he has blurred vision but denies nausea, vomiting, chest pain or shortness of breath. He also states the room is spinning. His family states that they had him in the car and they couldn't wake him up and then he would come to a little bit but he really would not respond. He reports he was pale without diaphoresis. EMS was called and patient does not recall being put in him as. Girlfriend states she thought when he first got here he did not recognize her. Family reports she was using both extremities normally. Mother patient is concerned because when he first was placed in the ED room he was seeing people in the corner and had slurred speech and he told him the goal weight. He reports he is normally on Dilantin 2 tablets twice a day however a couple days ago he was told to increase it to 3 in the morning and 2 at night he also took his first dose of Seroquel about 10 AM today.  PCP Dr. Olena Leatherwood Neurologist Dr. Melvyn Neth in [place]   Past Medical History  Diagnosis Date  . Diverticulosis        . Hypertension   . Seizures     Event recorder May, 2013, no significant abnormalities  . Asthma   . Acid reflux   . Migraines   . Depression   . Mood swings   . Chest pain with normal coronary angiography     a.  09/2011 Cath:  Normal Cors, NL LV fxn  . Sleep apnea     a. On CPAP  . Kidney stones     Past Surgical History  Procedure Date  . Hernia repair   .  Cholecystectomy   . Tonsillectomy   . Neck surgery   . Wrist fusion left  . Cardiac catheterization     Family History  Problem Relation Age of Onset  . Coronary artery disease Brother     27 MI and 10 years old  . Coronary artery disease Mother   . Coronary artery disease Father   . Stroke Mother   . Diabetes Mother     type 2  . Diabetes Father     type 2    History  Substance Use Topics  . Smoking status: Former Games developer  . Smokeless tobacco: Never Used  . Alcohol Use: No   Lives at home    Review of Systems  All other systems reviewed and are negative.    Allergies  Bee venom; Meloxicam; Penicillins; Sulfa antibiotics; Ketorolac tromethamine; and Lortab  Home Medications   Current Outpatient Rx  Name Route Sig Dispense Refill  . ALBUTEROL SULFATE HFA 108 (90 BASE) MCG/ACT IN AERS Inhalation Inhale 2 puffs into the lungs every 6 (six) hours as needed. For shortness of breath    . ASPIRIN 81 MG PO TABS Oral Take 81 mg by mouth daily.    Marland Kitchen BACLOFEN 5 MG HALF TABLET Oral Take 5  mg by mouth 3 (three) times daily.    Marland Kitchen EPINEPHRINE 0.15 MG/0.3ML IJ DEVI Intramuscular Inject 0.15 mg into the muscle daily as needed. For severe allergic reaction    . LACOSAMIDE 100 MG PO TABS Oral Take 200 mg by mouth 2 (two) times daily.    Marland Kitchen LISINOPRIL-HYDROCHLOROTHIAZIDE 20-12.5 MG PO TABS Oral Take 1 tablet by mouth daily.     Marland Kitchen LORATADINE 10 MG PO TABS Oral Take 10 mg by mouth daily.      . MULTI-VITAMIN/MINERALS PO TABS Oral Take 1 tablet by mouth 2 (two) times daily.     Marland Kitchen OMEPRAZOLE 20 MG PO CPDR Oral Take 20 mg by mouth daily.    Marland Kitchen PHENYTOIN SODIUM EXTENDED 100 MG PO CAPS Oral Take 200 mg by mouth 2 (two) times daily.     . QUETIAPINE FUMARATE ER 50 MG PO TB24 Oral Take 50 mg by mouth daily.    Marland Kitchen RANITIDINE HCL 150 MG PO TABS Oral Take 150 mg by mouth 2 (two) times daily.    Marland Kitchen VITAMIN C 500 MG PO TABS Oral Take 500 mg by mouth daily.    Marland Kitchen ZOLMITRIPTAN 2.5 MG PO TABS Oral Take  2.5 mg by mouth as needed. Migraine      BP 134/71  Pulse 72  Temp 98 F (36.7 C) (Oral)  Resp 18  SpO2 100%  Vital signs normal    Physical Exam  Nursing note and vitals reviewed. Constitutional: He is oriented to person, place, and time. He appears well-developed and well-nourished.  Non-toxic appearance. He does not appear ill. No distress.  HENT:  Head: Normocephalic and atraumatic.  Right Ear: External ear normal.  Left Ear: External ear normal.  Nose: Nose normal. No mucosal edema or rhinorrhea.  Mouth/Throat: Oropharynx is clear and moist and mucous membranes are normal. No dental abscesses or uvula swelling.  Eyes: Conjunctivae normal and EOM are normal. Pupils are equal, round, and reactive to light.  Neck: Normal range of motion and full passive range of motion without pain. Neck supple.  Cardiovascular: Normal rate, regular rhythm and normal heart sounds.  Exam reveals no gallop and no friction rub.   No murmur heard. Pulmonary/Chest: Effort normal and breath sounds normal. No respiratory distress. He has no wheezes. He has no rhonchi. He has no rales. He exhibits no tenderness and no crepitus.  Abdominal: Soft. Normal appearance and bowel sounds are normal. He exhibits no distension. There is no tenderness. There is no rebound and no guarding.  Musculoskeletal: Normal range of motion. He exhibits no edema and no tenderness.       Moves all extremities well.   Neurological: He is alert and oriented to person, place, and time. He has normal strength. No cranial nerve deficit.       Patient smiled was initially symmetrical and then he made his mouth asymmetrical, he does not attempt to grip strongly with his left upper extremity, he appears to have difficulty lifting his left leg against gravity.  Skin: Skin is warm, dry and intact. No rash noted. No erythema. No pallor.  Psychiatric: His speech is normal and behavior is normal. His mood appears not anxious.       Easily  agitated with his family    ED Course  Procedures (including critical care time)   Medications  baclofen (LIORESAL) 5 mg TABS (not administered)  QUEtiapine (SEROQUEL XR) 50 MG TB24 (not administered)  0.9 %  sodium chloride infusion (  Intravenous  New Bag/Given 03/01/12 2138)  dexamethasone (DECADRON) injection 10 mg (not administered)  ondansetron (ZOFRAN) injection 4 mg (4 mg Intravenous Given 03/01/12 2138)  metoCLOPramide (REGLAN) injection 10 mg (10 mg Intravenous Given 03/01/12 2137)  diphenhydrAMINE (BENADRYL) injection 25 mg (25 mg Intravenous Given 03/01/12 2139)   Patient symptoms are atypical, he has left-sided headache and left-sided numbness. Patient's physical exam also is suspicious for poor effort and faining weakness. He may have a Todds paralysis from either a migraine headache or unwitnessed seizure.  Patient sleeping when I entered the room, I touched his "numb" left arm and patient woke up. I had to leave the room to answer the phone and when I  came back he stated he had ambulated to the bathroom and he now has feeling in his lower left leg. He denies any difficulty walking. He states he is scheduled to have a 3 day EEG done at [facility] at the end of the month. He states he feels comfortable going home and he will return if his symptoms appear to be getting worse.  Results for orders placed during the hospital encounter of 03/01/12  CBC WITH DIFFERENTIAL      Component Value Range   WBC 6.2  4.0 - 10.5 K/uL   RBC 4.36  4.22 - 5.81 MIL/uL   Hemoglobin 13.1  13.0 - 17.0 g/dL   HCT 16.1 (*) 09.6 - 04.5 %   MCV 85.3  78.0 - 100.0 fL   MCH 30.0  26.0 - 34.0 pg   MCHC 35.2  30.0 - 36.0 g/dL   RDW 40.9  81.1 - 91.4 %   Platelets 220  150 - 400 K/uL   Neutrophils Relative 52  43 - 77 %   Neutro Abs 3.3  1.7 - 7.7 K/uL   Lymphocytes Relative 32  12 - 46 %   Lymphs Abs 2.0  0.7 - 4.0 K/uL   Monocytes Relative 9  3 - 12 %   Monocytes Absolute 0.6  0.1 - 1.0 K/uL     Eosinophils Relative 7 (*) 0 - 5 %   Eosinophils Absolute 0.4  0.0 - 0.7 K/uL   Basophils Relative 1  0 - 1 %   Basophils Absolute 0.0  0.0 - 0.1 K/uL  COMPREHENSIVE METABOLIC PANEL      Component Value Range   Sodium 137  135 - 145 mEq/L   Potassium 3.8  3.5 - 5.1 mEq/L   Chloride 101  96 - 112 mEq/L   CO2 26  19 - 32 mEq/L   Glucose, Bld 83  70 - 99 mg/dL   BUN 10  6 - 23 mg/dL   Creatinine, Ser 7.82  0.50 - 1.35 mg/dL   Calcium 9.4  8.4 - 95.6 mg/dL   Total Protein 6.9  6.0 - 8.3 g/dL   Albumin 4.2  3.5 - 5.2 g/dL   AST 33  0 - 37 U/L   ALT 49  0 - 53 U/L   Alkaline Phosphatase 99  39 - 117 U/L   Total Bilirubin 0.2 (*) 0.3 - 1.2 mg/dL   GFR calc non Af Amer >90  >90 mL/min   GFR calc Af Amer >90  >90 mL/min  PHENYTOIN LEVEL, TOTAL      Component Value Range   Phenytoin Lvl 19.1  10.0 - 20.0 ug/mL  TROPONIN I      Component Value Range   Troponin I <0.30  <0.30 ng/mL   Laboratory interpretation all  normal   Ct Head Wo Contrast  03/01/2012  *RADIOLOGY REPORT*  Clinical Data: Seizure, headache, left-sided weakness.  CT HEAD WITHOUT CONTRAST  Technique:  Contiguous axial images were obtained from the base of the skull through the vertex without contrast.  Comparison: 02/27/2012 MRI, 02/26/2012 CT  Findings: There is no evidence for acute hemorrhage, hydrocephalus, mass lesion, or abnormal extra-axial fluid collection.  No definite CT evidence for acute infarction.  Partially opacified ethmoid air cells.  Otherwise, the visualized paranasal sinuses and mastoid air cells are predominately clear.  IMPRESSION: No acute intracranial abnormality.  Partially opacified ethmoid air cells.  Correlate clinically for early/mild sinusitis.   Original Report Authenticated By: Waneta Martins, M.D.      Ct Head Wo Contrast  02/26/2012  *RADIOLOGY REPORT*  Clinical Data: Multiple seizures, head injury, confusion.  CT HEAD WITHOUT CONTRAST  Technique:  Contiguous axial images were obtained  from the base of the skull through the vertex without contrast.  Comparison: 01/20/2012.  Findings: Image quality is degraded by motion.  No evidence of acute infarct, acute hemorrhage, mass lesion, mass effect or hydrocephalus.  Scattered mucosal thickening in the paranasal sinuses.  Mastoid air cells are clear.  IMPRESSION:  1.  Image quality is degraded by motion. 2.  No definite acute findings.   Original Report Authenticated By: Reyes Ivan, M.D.    Mr Brain Wo Contrast  02/27/2012  *RADIOLOGY REPORT*  Clinical Data: Increasing seizure activity.  History of fall several years ago.  MRI HEAD WITHOUT CONTRAST  Technique:  Multiplanar, multiecho pulse sequences of the brain and surrounding structures were obtained according to standard protocol without intravenous contrast.  Comparison: Multiple prior CT head studies. Prior MR Brain 2007.  Findings: There is no evidence for acute infarction, intracranial hemorrhage, mass lesion, hydrocephalus, or extra-axial fluid. There is no generalized atrophy or white matter disease.  There are no foci of chronic hemorrhage.  Thin section heavily T2-weighted images demonstrate asymmetric prominence of the right temporal horn as compared to the left. The right choroidal fissure is also increased in size relative to the normal left.  I believe this is due to asymmetric loss of hippocampal volume on the right.  The right fornix and right mamillary body are slightly decreased in size compared to the left. 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 A similar appearance was present on prior MR 01/27/2006.  Major intracranial vascular structures widely patent.  Normal pituitary and cerebellar tonsils.  Upper cervical region unremarkable status post previous surgery in the C2 region.  No acute sinus or mastoid disease.  IMPRESSION: Focal right hippocampal atrophy and prominence of the temporal horn/choroidal fissure consistent with mesial  temporal sclerosis. This is likely the etiology of the patient's seizure disorder.  No acute or focal intracranial abnormality.   Original Report Authenticated By: Elsie Stain, M.D.       Date: 03/01/2012  Rate: 83  Rhythm: normal sinus rhythm  QRS Axis: normal  Intervals: normal  ST/T Wave abnormalities: normal  Conduction Disutrbances:RVH  Narrative Interpretation:   Old EKG Reviewed: unchanged from 12/16/2011     1. Numbness and tingling of left arm and leg   2. Altered mental state   3. Headache     Plan discharge  Devoria Albe, MD, FACEP   MDM           Ward Givens, MD 03/01/12 [phone]

## 2012-03-01 NOTE — ED Notes (Signed)
Per EMS- Patient was a passenger and had a seizure-like episode and was unresponsive. Upon arrival, EMS stated that he was aroused easily by a strnal rub, no neuro deficits and then was able to answer questions. Patient has been seen several times at [facility] ED for similar symptoms.

## 2012-03-01 NOTE — ED Notes (Signed)
Informed Dr. Denton Lank about pt left sided weakness

## 2012-03-01 NOTE — ED Notes (Signed)
FAO:[id number]<MV> Expected date:<BR> Expected time:<BR> Means of arrival:<BR> Comments:<BR> Seizure like activity, currently A&amp;O

## 2012-03-02 LAB — URINALYSIS, ROUTINE W REFLEX MICROSCOPIC
Bilirubin Urine: NEGATIVE
Ketones, ur: NEGATIVE mg/dL
Leukocytes, UA: NEGATIVE
Nitrite: NEGATIVE
Protein, ur: NEGATIVE mg/dL
Urobilinogen, UA: 0.2 mg/dL (ref 0.0–1.0)

## 2012-03-07 ENCOUNTER — Encounter: Payer: Self-pay | Admitting: Cardiology

## 2012-03-07 DIAGNOSIS — K219 Gastro-esophageal reflux disease without esophagitis: Secondary | ICD-10-CM | POA: Insufficient documentation

## 2012-03-07 DIAGNOSIS — G9381 Temporal sclerosis: Secondary | ICD-10-CM | POA: Insufficient documentation

## 2012-03-07 DIAGNOSIS — N200 Calculus of kidney: Secondary | ICD-10-CM | POA: Insufficient documentation

## 2012-03-07 DIAGNOSIS — F329 Major depressive disorder, single episode, unspecified: Secondary | ICD-10-CM | POA: Insufficient documentation

## 2012-03-07 DIAGNOSIS — R9430 Abnormal result of cardiovascular function study, unspecified: Secondary | ICD-10-CM | POA: Insufficient documentation

## 2012-03-07 DIAGNOSIS — R002 Palpitations: Secondary | ICD-10-CM | POA: Insufficient documentation

## 2012-03-07 DIAGNOSIS — G4730 Sleep apnea, unspecified: Secondary | ICD-10-CM | POA: Insufficient documentation

## 2012-03-07 DIAGNOSIS — I10 Essential (primary) hypertension: Secondary | ICD-10-CM | POA: Insufficient documentation

## 2012-03-07 DIAGNOSIS — R569 Unspecified convulsions: Secondary | ICD-10-CM | POA: Insufficient documentation

## 2012-03-07 DIAGNOSIS — R079 Chest pain, unspecified: Secondary | ICD-10-CM | POA: Insufficient documentation

## 2012-03-07 DIAGNOSIS — K5790 Diverticulosis of intestine, part unspecified, without perforation or abscess without bleeding: Secondary | ICD-10-CM | POA: Insufficient documentation

## 2012-03-07 DIAGNOSIS — R4586 Emotional lability: Secondary | ICD-10-CM | POA: Insufficient documentation

## 2012-03-07 DIAGNOSIS — F32A Depression, unspecified: Secondary | ICD-10-CM | POA: Insufficient documentation

## 2012-03-08 ENCOUNTER — Encounter: Payer: Self-pay | Admitting: Cardiology

## 2012-03-08 ENCOUNTER — Ambulatory Visit (INDEPENDENT_AMBULATORY_CARE_PROVIDER_SITE_OTHER): Payer: Medicaid Other | Admitting: Cardiology

## 2012-03-08 VITALS — BP 121/79 | HR 75 | Ht 68.0 in | Wt 245.0 lb

## 2012-03-08 DIAGNOSIS — K219 Gastro-esophageal reflux disease without esophagitis: Secondary | ICD-10-CM

## 2012-03-08 DIAGNOSIS — I10 Essential (primary) hypertension: Secondary | ICD-10-CM

## 2012-03-08 DIAGNOSIS — R002 Palpitations: Secondary | ICD-10-CM

## 2012-03-08 DIAGNOSIS — G4730 Sleep apnea, unspecified: Secondary | ICD-10-CM

## 2012-03-08 DIAGNOSIS — F3289 Other specified depressive episodes: Secondary | ICD-10-CM

## 2012-03-08 DIAGNOSIS — F329 Major depressive disorder, single episode, unspecified: Secondary | ICD-10-CM

## 2012-03-08 DIAGNOSIS — R569 Unspecified convulsions: Secondary | ICD-10-CM

## 2012-03-08 DIAGNOSIS — F32A Depression, unspecified: Secondary | ICD-10-CM

## 2012-03-08 DIAGNOSIS — R079 Chest pain, unspecified: Secondary | ICD-10-CM

## 2012-03-08 NOTE — Assessment & Plan Note (Signed)
Blood pressure is controlled. No change in therapy. 

## 2012-03-08 NOTE — Progress Notes (Signed)
HPI  Patient is seen for followup of chest pain.  I had seen him remotely in the past. More recently he was hospitalized in May, 2013 at [facility]. He did undergo catheterization at that time. He has normal coronary arteries. He's also been followed for hypertension and tachycardia. His major issues have been his seizures. He is as hospitalized at [facility]  in September. His seizures are being treated actively.  The patient does have chest tightness. This occurs primarily at rest. It does not affect his ability to be active.  As part of today's evaluation I have reviewed all of the cardiology records. I have reviewed the recent hospital records. I have updated the current electronic medical record very carefully.  Allergies  Allergen Reactions  . Bee Venom Anaphylaxis  . Meloxicam Hives  . Penicillins Hives  . Sulfa Antibiotics Hives  . Ketorolac Tromethamine Itching  . Lortab (Hydrocodone-Acetaminophen) Hives    Current Outpatient Prescriptions  Medication Sig Dispense Refill  . albuterol (PROVENTIL HFA;VENTOLIN HFA) 108 (90 BASE) MCG/ACT inhaler Inhale 2 puffs into the lungs every 6 (six) hours as needed. For shortness of breath      . aspirin 81 MG tablet Take 81 mg by mouth daily.      . baclofen (LIORESAL) 5 mg TABS Take 5 mg by mouth 3 (three) times daily as needed.       Marland Kitchen EPINEPHrine (EPIPEN JR) 0.15 MG/0.3ML injection Inject 0.15 mg into the muscle daily as needed. For severe allergic reaction      . Lacosamide (VIMPAT) 100 MG TABS Take 200 mg by mouth 2 (two) times daily.      Marland Kitchen lisinopril-hydrochlorothiazide (PRINZIDE,ZESTORETIC) 20-12.5 MG per tablet Take 1 tablet by mouth daily.       Marland Kitchen loratadine (CLARITIN) 10 MG tablet Take 10 mg by mouth daily.        . Multiple Vitamins-Minerals (MULTIVITAMIN WITH MINERALS) tablet Take 1 tablet by mouth 2 (two) times daily.       Marland Kitchen omeprazole (PRILOSEC) 20 MG capsule Take 20 mg by mouth daily.      . phenytoin (DILANTIN) 100 MG ER  capsule Take 200 mg by mouth 2 (two) times daily.       . QUEtiapine (SEROQUEL XR) 50 MG TB24 Take 50 mg by mouth daily.      . ranitidine (ZANTAC) 150 MG tablet Take 150 mg by mouth 2 (two) times daily.      . vitamin C (ASCORBIC ACID) 500 MG tablet Take 500 mg by mouth daily.      Marland Kitchen ZOLMitriptan (ZOMIG) 2.5 MG tablet Take 2.5 mg by mouth as needed. Migraine        History   Social History  . Marital Status: Single    Spouse Name: N/A    Number of Children: N/A  . Years of Education: N/A   Occupational History  . Not on file.   Social History Main Topics  . Smoking status: Never Smoker   . Smokeless tobacco: Never Used  . Alcohol Use: No  . Drug Use: No  . Sexually Active: Not on file   Other Topics Concern  . Not on file   Social History Narrative  . No narrative on file    Family History  Problem Relation Age of Onset  . Coronary artery disease Brother     19 MI and 53 years old  . Coronary artery disease Mother   . Coronary artery disease Father   .  Stroke Mother   . Diabetes Mother     type 2  . Diabetes Father     type 2    Past Medical History  Diagnosis Date  . Diverticulosis        . Hypertension   . Seizures     Event recorder May, 2013, no significant abnormalities  . Asthma   . Migraines   . Depression   . Mood swings   . Chest pain with normal coronary angiography     a.  09/2011 Cath:  Normal Cors, NL LV fxn  . Sleep apnea     a. On CPAP  . Kidney stones   . GERD (gastroesophageal reflux disease)   . Ejection fraction     Normal LV function, catheterization, May, 2013  . Palpitations     event recorder, June, 2013, normal sinus rhythm,  . Mesial temporal sclerosis     Diagnosis per neurology, September, 2013    Past Surgical History  Procedure Date  . Hernia repair   . Cholecystectomy   . Tonsillectomy   . Neck surgery   . Wrist fusion left  . Cardiac catheterization     Patient Active Problem List  Diagnosis  .  Diverticulosis  . Hypertension  . Depression  . Mood swings  . Chest pain with normal coronary angiography  . Sleep apnea  . Kidney stones  . GERD (gastroesophageal reflux disease)  . Ejection fraction  . Palpitations  . Seizures  . Mesial temporal sclerosis    ROS   Patient denies fever, chills, headache, sweats, rash, change in vision, change in hearing, nausea vomiting, urinary symptoms. All other systems are reviewed and are negative.  PHYSICAL EXAM  Patient is overweight but stable. He is oriented to person time and place. Affect is normal. There is no jugulovenous distention. Lungs are clear. Respiratory effort is nonlabored. Cardiac exam reveals S1-S2. There no clicks or significant murmurs. Abdomen is soft. There is no peripheral edema. There are no musculoskeletal deformities. There are no skin rashes.  Filed Vitals:   03/08/12 0844  BP: 121/79  Pulse: 75  Height: 5\' 8"  (1.727 m)  Weight: 245 lb (111.131 kg)    EKG  ASSESSMENT & PLAN

## 2012-03-08 NOTE — Assessment & Plan Note (Signed)
He is strongly encouraged to use his CPAP.

## 2012-03-08 NOTE — Assessment & Plan Note (Signed)
GERD may play a role in some of his chest symptoms.

## 2012-03-08 NOTE — Assessment & Plan Note (Signed)
Seizures are a major problem for him. He continues to get excellent care for this.

## 2012-03-08 NOTE — Assessment & Plan Note (Signed)
He seems stable in this regard today. No change in therapy

## 2012-03-08 NOTE — Patient Instructions (Addendum)

## 2012-03-08 NOTE — Assessment & Plan Note (Signed)
Patient continues to have some chest  Discomfort. I feel that this is noncardiac in origin. I have reassured him.

## 2012-03-08 NOTE — Assessment & Plan Note (Signed)
We know from an event recorder in June, 2013 that he has normal sinus rhythm. His palpitations are not related to any significant arrhythmias.

## 2012-03-22 ENCOUNTER — Emergency Department (HOSPITAL_COMMUNITY): Payer: Medicaid Other

## 2012-03-22 ENCOUNTER — Encounter (HOSPITAL_COMMUNITY): Payer: Self-pay | Admitting: Emergency Medicine

## 2012-03-22 ENCOUNTER — Emergency Department (HOSPITAL_COMMUNITY)
Admission: EM | Admit: 2012-03-22 | Discharge: 2012-03-22 | Disposition: A | Discharge status: Home / Self Care | Payer: Medicaid Other | Attending: Emergency Medicine | Admitting: Emergency Medicine

## 2012-03-22 DIAGNOSIS — F3289 Other specified depressive episodes: Secondary | ICD-10-CM | POA: Insufficient documentation

## 2012-03-22 DIAGNOSIS — R002 Palpitations: Secondary | ICD-10-CM | POA: Insufficient documentation

## 2012-03-22 DIAGNOSIS — G4730 Sleep apnea, unspecified: Secondary | ICD-10-CM | POA: Insufficient documentation

## 2012-03-22 DIAGNOSIS — Z87442 Personal history of urinary calculi: Secondary | ICD-10-CM | POA: Insufficient documentation

## 2012-03-22 DIAGNOSIS — F39 Unspecified mood [affective] disorder: Secondary | ICD-10-CM | POA: Insufficient documentation

## 2012-03-22 DIAGNOSIS — R109 Unspecified abdominal pain: Secondary | ICD-10-CM | POA: Insufficient documentation

## 2012-03-22 DIAGNOSIS — G9381 Temporal sclerosis: Secondary | ICD-10-CM | POA: Insufficient documentation

## 2012-03-22 DIAGNOSIS — R569 Unspecified convulsions: Secondary | ICD-10-CM | POA: Insufficient documentation

## 2012-03-22 DIAGNOSIS — J45909 Unspecified asthma, uncomplicated: Secondary | ICD-10-CM | POA: Insufficient documentation

## 2012-03-22 DIAGNOSIS — Z8669 Personal history of other diseases of the nervous system and sense organs: Secondary | ICD-10-CM | POA: Insufficient documentation

## 2012-03-22 DIAGNOSIS — K5730 Diverticulosis of large intestine without perforation or abscess without bleeding: Secondary | ICD-10-CM | POA: Insufficient documentation

## 2012-03-22 DIAGNOSIS — F329 Major depressive disorder, single episode, unspecified: Secondary | ICD-10-CM | POA: Insufficient documentation

## 2012-03-22 DIAGNOSIS — K219 Gastro-esophageal reflux disease without esophagitis: Secondary | ICD-10-CM | POA: Insufficient documentation

## 2012-03-22 DIAGNOSIS — I10 Essential (primary) hypertension: Secondary | ICD-10-CM | POA: Insufficient documentation

## 2012-03-22 DIAGNOSIS — Z79899 Other long term (current) drug therapy: Secondary | ICD-10-CM | POA: Insufficient documentation

## 2012-03-22 DIAGNOSIS — Z7982 Long term (current) use of aspirin: Secondary | ICD-10-CM | POA: Insufficient documentation

## 2012-03-22 LAB — POCT I-STAT, CHEM 8
Creatinine, Ser: 1 mg/dL (ref 0.50–1.35)
HCT: 43 % (ref 39.0–52.0)
Hemoglobin: 14.6 g/dL (ref 13.0–17.0)
Potassium: 3.7 mEq/L (ref 3.5–5.1)
Sodium: 141 mEq/L (ref 135–145)
TCO2: 22 mmol/L (ref 0–100)

## 2012-03-22 LAB — CBC
Hemoglobin: 14.8 g/dL (ref 13.0–17.0)
MCH: 30.6 pg (ref 26.0–34.0)
MCHC: 36.2 g/dL — ABNORMAL HIGH (ref 30.0–36.0)
Platelets: 230 10*3/uL (ref 150–400)
RDW: 12.8 % (ref 11.5–15.5)

## 2012-03-22 MED ORDER — HYDROMORPHONE HCL PF 1 MG/ML IJ SOLN
1.0000 mg | Freq: Once | INTRAMUSCULAR | Status: AC
  Administered 2012-03-22: 1 mg via INTRAVENOUS
  Filled 2012-03-22: qty 1

## 2012-03-22 MED ORDER — ONDANSETRON 4 MG PO TBDP
ORAL_TABLET | ORAL | Status: AC
  Administered 2012-03-22: 4 mg
  Filled 2012-03-22: qty 1

## 2012-03-22 MED ORDER — ONDANSETRON HCL 4 MG PO TABS: 4.0000 mg | ORAL_TABLET | Freq: Once | ORAL | Status: DC

## 2012-03-22 MED ORDER — ACETAMINOPHEN 325 MG PO TABS
650.0000 mg | ORAL_TABLET | Freq: Once | ORAL | Status: DC
  Filled 2012-03-22: qty 2

## 2012-03-22 MED ORDER — IOHEXOL 300 MG/ML  SOLN
100.0000 mL | Freq: Once | INTRAMUSCULAR | Status: AC | PRN
  Administered 2012-03-22: 100 mL via INTRAVENOUS

## 2012-03-22 MED ORDER — ACETAMINOPHEN-CODEINE #3 300-30 MG PO TABS: 1.0000 | ORAL_TABLET | Freq: Four times a day (QID) | ORAL | Status: DC | PRN

## 2012-03-22 MED ORDER — IBUPROFEN 800 MG PO TABS
800.0000 mg | ORAL_TABLET | Freq: Once | ORAL | Status: AC
  Administered 2012-03-22: 800 mg via ORAL
  Filled 2012-03-22: qty 1

## 2012-03-22 MED ORDER — SODIUM CHLORIDE 0.9 % IV SOLN: Freq: Once | INTRAVENOUS | Status: DC

## 2012-03-22 NOTE — ED Provider Notes (Addendum)
History     CSN: [id number]  Arrival date & time 03/22/12  4098   First MD Initiated Contact with Patient 03/22/12 0224      Chief Complaint  Patient presents with  . Abdominal Pain    (Consider location/radiation/quality/duration/timing/severity/associated sxs/prior treatment) HPI Comments: R sidied abdominal pain worse with ambulation N/V but no diarrhea/constipation/dysuria testicular pain/trauma  The history is provided by the patient.    Past Medical History  Diagnosis Date  . Diverticulosis        . Hypertension   . Seizures     Event recorder May, 2013, no significant abnormalities  . Asthma   . Migraines   . Depression   . Mood swings   . Chest pain with normal coronary angiography     a.  09/2011 Cath:  Normal Cors, NL LV fxn  . Sleep apnea     a. On CPAP  . Kidney stones   . GERD (gastroesophageal reflux disease)   . Ejection fraction     Normal LV function, catheterization, May, 2013  . Palpitations     event recorder, June, 2013, normal sinus rhythm,  . Mesial temporal sclerosis     Diagnosis per neurology, September, 2013    Past Surgical History  Procedure Date  . Hernia repair   . Cholecystectomy   . Tonsillectomy   . Neck surgery   . Wrist fusion left  . Cardiac catheterization     Family History  Problem Relation Age of Onset  . Coronary artery disease Brother     83 MI and 40 years old  . Coronary artery disease Mother   . Coronary artery disease Father   . Stroke Mother   . Diabetes Mother     type 2  . Diabetes Father     type 2    History  Substance Use Topics  . Smoking status: Never Smoker   . Smokeless tobacco: Never Used  . Alcohol Use: No      Review of Systems  Constitutional: Negative for fever.  Respiratory: Negative for shortness of breath.   Gastrointestinal: Positive for nausea, vomiting and abdominal pain. Negative for diarrhea and constipation.  Genitourinary: Negative for dysuria, frequency, flank pain  and testicular pain.  Skin: Negative for rash and wound.    Allergies  Bee venom; Meloxicam; Penicillins; Sulfa antibiotics; Ketorolac tromethamine; and Lortab  Home Medications   Current Outpatient Rx  Name Route Sig Dispense Refill  . ALBUTEROL SULFATE HFA 108 (90 BASE) MCG/ACT IN AERS Inhalation Inhale 2 puffs into the lungs every 6 (six) hours as needed. For shortness of breath    . ASPIRIN 81 MG PO TABS Oral Take 81 mg by mouth daily.    Marland Kitchen EPINEPHRINE 0.15 MG/0.3ML IJ DEVI Intramuscular Inject 0.15 mg into the muscle daily as needed. For severe allergic reaction    . LACOSAMIDE 100 MG PO TABS Oral Take 200 mg by mouth 2 (two) times daily.     Marland Kitchen LISINOPRIL-HYDROCHLOROTHIAZIDE 20-12.5 MG PO TABS Oral Take 1 tablet by mouth daily.     Marland Kitchen LORATADINE 10 MG PO TABS Oral Take 10 mg by mouth daily.      . MULTI-VITAMIN/MINERALS PO TABS Oral Take 1 tablet by mouth 2 (two) times daily.     Marland Kitchen OMEPRAZOLE 20 MG PO CPDR Oral Take 20 mg by mouth daily.    Marland Kitchen PHENYTOIN SODIUM EXTENDED 100 MG PO CAPS Oral Take 200 mg by mouth 2 (two) times daily.     Marland Kitchen  QUETIAPINE FUMARATE ER 50 MG PO TB24 Oral Take 50 mg by mouth daily.    . SERTRALINE HCL 100 MG PO TABS Oral Take 100 mg by mouth daily.    Marland Kitchen VITAMIN C 500 MG PO TABS Oral Take 500 mg by mouth daily.    Marland Kitchen ZOLMITRIPTAN 2.5 MG PO TABS Oral Take 2.5 mg by mouth as needed. Migraine    . ACETAMINOPHEN-CODEINE #3 300-30 MG PO TABS Oral Take 1 tablet by mouth every 6 (six) hours as needed for pain. 15 tablet 0    BP 148/74  Pulse 81  Temp 98.2 F (36.8 C) (Oral)  Resp 18  SpO2 98%  Physical Exam  Constitutional: He appears well-developed.  HENT:  Head: Normocephalic.  Eyes: Pupils are equal, round, and reactive to light.  Neck: Normal range of motion.  Cardiovascular: Normal rate.   Pulmonary/Chest: Effort normal.  Abdominal: There is tenderness.    Musculoskeletal: Normal range of motion.  Neurological: He is alert.  Skin: Skin is warm.     ED Course  Procedures (including critical care time)  Labs Reviewed  CBC - Abnormal; Notable for the following:    MCHC 36.2 (*)     All other components within normal limits  POCT I-STAT, CHEM 8 - Abnormal; Notable for the following:    Glucose, Bld 109 (*)     All other components within normal limits   Ct Abdomen Pelvis W Contrast  03/22/2012  *RADIOLOGY REPORT*  Clinical Data: Right lower quadrant pain  CT ABDOMEN AND PELVIS WITH CONTRAST  Technique:  Multidetector CT imaging of the abdomen and pelvis was performed following the standard protocol during bolus administration of intravenous contrast.  Contrast: OMNIPAQUE IOHEXOL 300 MG/ML  SOLN  Comparison: 10/26/2011  Findings: Limited images through the lung bases demonstrate no significant appreciable abnormality. The heart size is within normal limits. No pleural or pericardial effusion.  Low attenuation of the liver is nonspecific post contrast however suggests fatty infiltration.  Absent gallbladder.  No biliary ductal dilatation.  Unremarkable spleen, pancreas, adrenal glands.  Symmetric renal enhancement.  No hydronephrosis or hydroureter.  Colonic diverticulosis.  No CT evidence for colitis or diverticulitis.  No bowel obstruction.  Normal appendix.  No free intraperitoneal air or fluid.  No lymphadenopathy.  Thin-walled bladder. Normal caliber vasculature.  No acute osseous finding. Evidence of prior periumbilical surgery and right inguinal hernia repair.  IMPRESSION: Normal appendix.  No acute abdominopelvic process identified.  Colonic diverticulosis.  Question hepatic steatosis.   Original Report Authenticated By: Waneta Martins, M.D.      1. Abdominal wall pain       MDM  Labs the CT scan, normal        Arman Filter, NP 03/22/12 1610  Arman Filter, NP 03/22/12 9604  Arman Filter, NP 03/24/12 2028  Arman Filter, NP 04/05/12 2152  Arman Filter, NP 04/12/12 [phone]

## 2012-03-22 NOTE — ED Provider Notes (Signed)
Medical screening examination/treatment/procedure(s) were performed by non-physician practitioner and as supervising physician I was immediately available for consultation/collaboration.  Charlyn Vialpando, MD 03/22/12 0646 

## 2012-03-22 NOTE — ED Notes (Signed)
Pt states he is having right side abd pain that started yesterday evening  Pt states the pain is sharp and when he walks feels like everything inside is pulling  Nausea and vomiting with the pain  Denies diarrhea

## 2012-03-26 NOTE — ED Provider Notes (Signed)
Medical screening examination/treatment/procedure(s) were performed by non-physician practitioner and as supervising physician I was immediately available for consultation/collaboration.  Juniel Groene, MD 03/26/12 0721 

## 2012-04-08 NOTE — ED Provider Notes (Signed)
Medical screening examination/treatment/procedure(s) were performed by non-physician practitioner and as supervising physician I was immediately available for consultation/collaboration.  Joselinne Lawal, MD 04/08/12 1505 

## 2012-04-13 DIAGNOSIS — R109 Unspecified abdominal pain: Secondary | ICD-10-CM

## 2012-04-13 NOTE — ED Provider Notes (Signed)
Medical screening examination/treatment/procedure(s) were performed by non-physician practitioner and as supervising physician I was immediately available for consultation/collaboration.  Devarion Mcclanahan, MD 04/13/12 0703 

## 2012-04-24 ENCOUNTER — Emergency Department (HOSPITAL_COMMUNITY)
Admission: EM | Admit: 2012-04-24 | Discharge: 2012-04-24 | Disposition: A | Discharge status: Home / Self Care | Payer: Medicaid Other | Attending: Emergency Medicine | Admitting: Emergency Medicine

## 2012-04-24 ENCOUNTER — Encounter (HOSPITAL_COMMUNITY): Payer: Self-pay | Admitting: *Deleted

## 2012-04-24 DIAGNOSIS — R002 Palpitations: Secondary | ICD-10-CM | POA: Insufficient documentation

## 2012-04-24 DIAGNOSIS — K219 Gastro-esophageal reflux disease without esophagitis: Secondary | ICD-10-CM | POA: Insufficient documentation

## 2012-04-24 DIAGNOSIS — Y929 Unspecified place or not applicable: Secondary | ICD-10-CM | POA: Insufficient documentation

## 2012-04-24 DIAGNOSIS — J45909 Unspecified asthma, uncomplicated: Secondary | ICD-10-CM | POA: Insufficient documentation

## 2012-04-24 DIAGNOSIS — G43909 Migraine, unspecified, not intractable, without status migrainosus: Secondary | ICD-10-CM | POA: Insufficient documentation

## 2012-04-24 DIAGNOSIS — X58XXXA Exposure to other specified factors, initial encounter: Secondary | ICD-10-CM | POA: Insufficient documentation

## 2012-04-24 DIAGNOSIS — F329 Major depressive disorder, single episode, unspecified: Secondary | ICD-10-CM | POA: Insufficient documentation

## 2012-04-24 DIAGNOSIS — Y939 Activity, unspecified: Secondary | ICD-10-CM | POA: Insufficient documentation

## 2012-04-24 DIAGNOSIS — K5730 Diverticulosis of large intestine without perforation or abscess without bleeding: Secondary | ICD-10-CM | POA: Insufficient documentation

## 2012-04-24 DIAGNOSIS — Z7982 Long term (current) use of aspirin: Secondary | ICD-10-CM | POA: Insufficient documentation

## 2012-04-24 DIAGNOSIS — N200 Calculus of kidney: Secondary | ICD-10-CM | POA: Insufficient documentation

## 2012-04-24 DIAGNOSIS — G4730 Sleep apnea, unspecified: Secondary | ICD-10-CM | POA: Insufficient documentation

## 2012-04-24 DIAGNOSIS — S025XXA Fracture of tooth (traumatic), initial encounter for closed fracture: Secondary | ICD-10-CM | POA: Insufficient documentation

## 2012-04-24 DIAGNOSIS — I10 Essential (primary) hypertension: Secondary | ICD-10-CM | POA: Insufficient documentation

## 2012-04-24 DIAGNOSIS — Z79899 Other long term (current) drug therapy: Secondary | ICD-10-CM | POA: Insufficient documentation

## 2012-04-24 DIAGNOSIS — F3289 Other specified depressive episodes: Secondary | ICD-10-CM | POA: Insufficient documentation

## 2012-04-24 MED ORDER — OXYCODONE-ACETAMINOPHEN 5-325 MG PO TABS: 1.0000 | ORAL_TABLET | Freq: Four times a day (QID) | ORAL | Status: DC | PRN

## 2012-04-24 MED ORDER — CLINDAMYCIN HCL 150 MG PO CAPS
300.0000 mg | ORAL_CAPSULE | Freq: Once | ORAL | Status: AC
  Administered 2012-04-24: 300 mg via ORAL
  Filled 2012-04-24: qty 2

## 2012-04-24 MED ORDER — OXYCODONE-ACETAMINOPHEN 5-325 MG PO TABS
2.0000 | ORAL_TABLET | Freq: Once | ORAL | Status: AC
  Administered 2012-04-24: 2 via ORAL
  Filled 2012-04-24: qty 2

## 2012-04-24 MED ORDER — CLINDAMYCIN HCL 300 MG PO CAPS: 300.0000 mg | ORAL_CAPSULE | Freq: Four times a day (QID) | ORAL | Status: DC
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
## 2012-04-24 NOTE — ED Notes (Signed)
Dental pain to left upper side.  Unable to see dentist until Tues.

## 2012-04-30 ENCOUNTER — Encounter (HOSPITAL_COMMUNITY): Payer: Self-pay | Admitting: Emergency Medicine

## 2012-04-30 ENCOUNTER — Emergency Department (HOSPITAL_COMMUNITY)
Admission: EM | Admit: 2012-04-30 | Discharge: 2012-04-30 | Disposition: A | Discharge status: Home / Self Care | Payer: Medicaid Other | Attending: Emergency Medicine | Admitting: Emergency Medicine

## 2012-04-30 DIAGNOSIS — J45909 Unspecified asthma, uncomplicated: Secondary | ICD-10-CM | POA: Insufficient documentation

## 2012-04-30 DIAGNOSIS — K5730 Diverticulosis of large intestine without perforation or abscess without bleeding: Secondary | ICD-10-CM | POA: Insufficient documentation

## 2012-04-30 DIAGNOSIS — G40909 Epilepsy, unspecified, not intractable, without status epilepticus: Secondary | ICD-10-CM | POA: Insufficient documentation

## 2012-04-30 DIAGNOSIS — F329 Major depressive disorder, single episode, unspecified: Secondary | ICD-10-CM | POA: Insufficient documentation

## 2012-04-30 DIAGNOSIS — I10 Essential (primary) hypertension: Secondary | ICD-10-CM | POA: Insufficient documentation

## 2012-04-30 DIAGNOSIS — N200 Calculus of kidney: Secondary | ICD-10-CM | POA: Insufficient documentation

## 2012-04-30 DIAGNOSIS — Z79899 Other long term (current) drug therapy: Secondary | ICD-10-CM | POA: Insufficient documentation

## 2012-04-30 DIAGNOSIS — F3289 Other specified depressive episodes: Secondary | ICD-10-CM | POA: Insufficient documentation

## 2012-04-30 DIAGNOSIS — S025XXA Fracture of tooth (traumatic), initial encounter for closed fracture: Secondary | ICD-10-CM | POA: Insufficient documentation

## 2012-04-30 DIAGNOSIS — G4730 Sleep apnea, unspecified: Secondary | ICD-10-CM | POA: Insufficient documentation

## 2012-04-30 DIAGNOSIS — S0993XA Unspecified injury of face, initial encounter: Secondary | ICD-10-CM

## 2012-04-30 DIAGNOSIS — Y939 Activity, unspecified: Secondary | ICD-10-CM | POA: Insufficient documentation

## 2012-04-30 DIAGNOSIS — Z7982 Long term (current) use of aspirin: Secondary | ICD-10-CM | POA: Insufficient documentation

## 2012-04-30 DIAGNOSIS — G43909 Migraine, unspecified, not intractable, without status migrainosus: Secondary | ICD-10-CM | POA: Insufficient documentation

## 2012-04-30 DIAGNOSIS — K219 Gastro-esophageal reflux disease without esophagitis: Secondary | ICD-10-CM | POA: Insufficient documentation

## 2012-04-30 DIAGNOSIS — Y929 Unspecified place or not applicable: Secondary | ICD-10-CM | POA: Insufficient documentation

## 2012-04-30 DIAGNOSIS — X58XXXA Exposure to other specified factors, initial encounter: Secondary | ICD-10-CM | POA: Insufficient documentation

## 2012-04-30 MED ORDER — CLINDAMYCIN HCL 150 MG PO CAPS: 300.0000 mg | ORAL_CAPSULE | Freq: Three times a day (TID) | ORAL | Status: DC

## 2012-04-30 MED ORDER — OXYCODONE-ACETAMINOPHEN 5-325 MG PO TABS: 1.0000 | ORAL_TABLET | ORAL | Status: DC | PRN

## 2012-04-30 MED ORDER — OXYCODONE-ACETAMINOPHEN 5-325 MG PO TABS
2.0000 | ORAL_TABLET | Freq: Once | ORAL | Status: AC
  Administered 2012-04-30: 2 via ORAL
  Filled 2012-04-30: qty 2

## 2012-04-30 NOTE — ED Notes (Signed)
Patient c/o left upper tooth pain; states tooth has been breaking.  Patient has dental appointment for consultation on 12/9.

## 2012-04-30 NOTE — ED Provider Notes (Signed)
History     CSN: [id number]  Arrival date & time 04/30/12  0059   First MD Initiated Contact with Patient 04/30/12 0158      Chief Complaint  Patient presents with  . Dental Pain    (Consider location/radiation/quality/duration/timing/severity/associated sxs/prior treatment) HPI Comments: 31 year old male who presents with left upper second molar pain after progressive fracture of the tooth which is been going on for several weeks. He has been seen by his dentist and referred to an oral surgeon for dental extraction. The pain is persistent, worse with chewing, not associated with fever or swelling of the jaw. At this time the pain is mild to moderate  Patient is a 31 y.o. male presenting with tooth pain. The history is provided by the patient.  Dental PainPrimary symptoms do not include fever.  Additional symptoms do not include: facial swelling.    Past Medical History  Diagnosis Date  . Diverticulosis        . Hypertension   . Seizures     Event recorder May, 2013, no significant abnormalities  . Asthma   . Migraines   . Depression   . Mood swings   . Chest pain with normal coronary angiography     a.  09/2011 Cath:  Normal Cors, NL LV fxn  . Sleep apnea     a. On CPAP  . Kidney stones   . GERD (gastroesophageal reflux disease)   . Ejection fraction     Normal LV function, catheterization, May, 2013  . Palpitations     event recorder, June, 2013, normal sinus rhythm,  . Mesial temporal sclerosis     Diagnosis per neurology, September, 2013    Past Surgical History  Procedure Date  . Hernia repair   . Cholecystectomy   . Tonsillectomy   . Neck surgery   . Wrist fusion left  . Cardiac catheterization     Family History  Problem Relation Age of Onset  . Coronary artery disease Brother     59 MI and 43 years old  . Coronary artery disease Mother   . Coronary artery disease Father   . Stroke Mother   . Diabetes Mother     type 2  . Diabetes Father    type 2    History  Substance Use Topics  . Smoking status: Never Smoker   . Smokeless tobacco: Never Used  . Alcohol Use: No      Review of Systems  Constitutional: Negative for fever.  HENT: Negative for facial swelling.        Toothache    Allergies  Bee venom; Meloxicam; Penicillins; Sulfa antibiotics; Ketorolac tromethamine; and Lortab  Home Medications   Current Outpatient Rx  Name  Route  Sig  Dispense  Refill  . ALBUTEROL SULFATE HFA 108 (90 BASE) MCG/ACT IN AERS   Inhalation   Inhale 2 puffs into the lungs every 6 (six) hours as needed. For shortness of breath         . ASPIRIN EC 81 MG PO TBEC   Oral   Take 81 mg by mouth daily.         Marland Kitchen CLINDAMYCIN HCL 300 MG PO CAPS   Oral   Take 1 capsule (300 mg total) by mouth 4 (four) times daily. X 7 days   28 capsule   0   . EPINEPHRINE 0.15 MG/0.3ML IJ DEVI   Intramuscular   Inject 0.15 mg into the muscle daily as needed. For severe  allergic reaction         . LACOSAMIDE 100 MG PO TABS   Oral   Take 200 mg by mouth 2 (two) times daily.          Marland Kitchen LISINOPRIL-HYDROCHLOROTHIAZIDE 20-12.5 MG PO TABS   Oral   Take 1 tablet by mouth daily.          Marland Kitchen LORATADINE 10 MG PO TABS   Oral   Take 10 mg by mouth daily.           . MULTI-VITAMIN/MINERALS PO TABS   Oral   Take 1 tablet by mouth 2 (two) times daily.          Marland Kitchen OMEPRAZOLE 20 MG PO CPDR   Oral   Take 20 mg by mouth daily.         Marland Kitchen PHENYTOIN SODIUM EXTENDED 100 MG PO CAPS   Oral   Take 200 mg by mouth 2 (two) times daily.          . QUETIAPINE FUMARATE ER 50 MG PO TB24   Oral   Take 50 mg by mouth daily.         Marland Kitchen VITAMIN C 500 MG PO TABS   Oral   Take 500 mg by mouth daily.         Marland Kitchen ZOLMITRIPTAN 2.5 MG PO TABS   Oral   Take 2.5 mg by mouth as needed. Migraine         . CLINDAMYCIN HCL 150 MG PO CAPS   Oral   Take 2 capsules (300 mg total) by mouth 3 (three) times daily. May dispense as 150mg  capsules   60  capsule   0   . OXYCODONE-ACETAMINOPHEN 5-325 MG PO TABS   Oral   Take 1 tablet by mouth every 4 (four) hours as needed for pain.   20 tablet   0   . OXYCODONE-ACETAMINOPHEN 5-325 MG PO TABS   Oral   Take 1-2 tablets by mouth every 6 (six) hours as needed for pain.   20 tablet   0     BP 143/80  Pulse 94  Temp 97.5 F (36.4 C) (Oral)  Resp 18  Ht 5\' 9"  (1.753 m)  Wt 245 lb (111.131 kg)  BMI 36.18 kg/m2  SpO2 97%  Physical Exam  Nursing note and vitals reviewed. Constitutional: He appears well-developed and well-nourished. No distress.  HENT:  Head: Normocephalic and atraumatic.  Mouth/Throat: Oropharynx is clear and moist. No oropharyngeal exudate.       Dental Disease - multiple cavities with fillings, left upper second molar with fracture on the posterior side  Eyes: Conjunctivae normal are normal. No scleral icterus.  Neck: Normal range of motion. Neck supple. No thyromegaly present.  Cardiovascular: Normal rate and regular rhythm.   Pulmonary/Chest: Effort normal and breath sounds normal.  Lymphadenopathy:    He has no cervical adenopathy.  Neurological: He is alert.  Skin: Skin is warm and dry. No rash noted. He is not diaphoretic.    ED Course  Procedures (including critical care time)  Labs Reviewed - No data to display No results found.   1. Dental injury       MDM  Patient appears well, normal vitals, no fever, no jaw swelling, antibiotics and pain medication home with dental surgery followup which he has on December 9. No sign of Ludwig angina.        Vida Roller, MD 04/30/12 [phone]

## 2012-05-14 ENCOUNTER — Encounter (HOSPITAL_COMMUNITY): Payer: Self-pay | Admitting: *Deleted

## 2012-05-14 ENCOUNTER — Emergency Department (HOSPITAL_COMMUNITY)
Admission: EM | Admit: 2012-05-14 | Discharge: 2012-05-14 | Disposition: A | Discharge status: Home / Self Care | Payer: Medicaid Other | Attending: Emergency Medicine | Admitting: Emergency Medicine

## 2012-05-14 DIAGNOSIS — K0889 Other specified disorders of teeth and supporting structures: Secondary | ICD-10-CM

## 2012-05-14 DIAGNOSIS — Z8669 Personal history of other diseases of the nervous system and sense organs: Secondary | ICD-10-CM | POA: Insufficient documentation

## 2012-05-14 DIAGNOSIS — K089 Disorder of teeth and supporting structures, unspecified: Secondary | ICD-10-CM | POA: Insufficient documentation

## 2012-05-14 DIAGNOSIS — J45909 Unspecified asthma, uncomplicated: Secondary | ICD-10-CM | POA: Insufficient documentation

## 2012-05-14 DIAGNOSIS — F329 Major depressive disorder, single episode, unspecified: Secondary | ICD-10-CM | POA: Insufficient documentation

## 2012-05-14 DIAGNOSIS — Z8679 Personal history of other diseases of the circulatory system: Secondary | ICD-10-CM | POA: Insufficient documentation

## 2012-05-14 DIAGNOSIS — Z7982 Long term (current) use of aspirin: Secondary | ICD-10-CM | POA: Insufficient documentation

## 2012-05-14 DIAGNOSIS — Z79899 Other long term (current) drug therapy: Secondary | ICD-10-CM | POA: Insufficient documentation

## 2012-05-14 DIAGNOSIS — I10 Essential (primary) hypertension: Secondary | ICD-10-CM | POA: Insufficient documentation

## 2012-05-14 DIAGNOSIS — Z87442 Personal history of urinary calculi: Secondary | ICD-10-CM | POA: Insufficient documentation

## 2012-05-14 DIAGNOSIS — G40909 Epilepsy, unspecified, not intractable, without status epilepticus: Secondary | ICD-10-CM | POA: Insufficient documentation

## 2012-05-14 DIAGNOSIS — K219 Gastro-esophageal reflux disease without esophagitis: Secondary | ICD-10-CM | POA: Insufficient documentation

## 2012-05-14 DIAGNOSIS — Z8719 Personal history of other diseases of the digestive system: Secondary | ICD-10-CM | POA: Insufficient documentation

## 2012-05-14 DIAGNOSIS — F3289 Other specified depressive episodes: Secondary | ICD-10-CM | POA: Insufficient documentation

## 2012-05-14 MED ORDER — OXYCODONE-ACETAMINOPHEN 5-325 MG PO TABS
ORAL_TABLET | ORAL | Status: AC
  Administered 2012-05-14: 2
  Filled 2012-05-14: qty 2

## 2012-05-14 MED ORDER — OXYCODONE-ACETAMINOPHEN 5-325 MG PO TABS: 1.0000 | ORAL_TABLET | Freq: Four times a day (QID) | ORAL | Status: DC | PRN

## 2012-05-14 NOTE — ED Notes (Signed)
Pt states has cracked tooth. Has seen dentist & cant have it removed until the 31st.

## 2012-05-14 NOTE — ED Provider Notes (Addendum)
History     CSN: [id number]  Arrival date & time 05/14/12  0128   First MD Initiated Contact with Patient 05/14/12 0157      Chief Complaint  Patient presents with  . Dental Pain    (Consider location/radiation/quality/duration/timing/severity/associated sxs/prior treatment) HPI Comments: Patient has been seen here twice for dental pain.  He is to see the dentist the end of the month.  He reports he is having pain that is unrelieved with ibuprofen.  No fevers or chills.    Patient is a 31 y.o. male presenting with tooth pain. The history is provided by the patient.  Dental PainThe primary symptoms include mouth pain. Episode onset: several weeks ago. The symptoms are worsening. The symptoms are new. The symptoms occur constantly.    Past Medical History  Diagnosis Date  . Diverticulosis        . Hypertension   . Seizures     Event recorder May, 2013, no significant abnormalities  . Asthma   . Migraines   . Depression   . Mood swings   . Chest pain with normal coronary angiography     a.  09/2011 Cath:  Normal Cors, NL LV fxn  . Sleep apnea     a. On CPAP  . Kidney stones   . GERD (gastroesophageal reflux disease)   . Ejection fraction     Normal LV function, catheterization, May, 2013  . Palpitations     event recorder, June, 2013, normal sinus rhythm,  . Mesial temporal sclerosis     Diagnosis per neurology, September, 2013    Past Surgical History  Procedure Date  . Hernia repair   . Cholecystectomy   . Tonsillectomy   . Neck surgery   . Wrist fusion left  . Cardiac catheterization     Family History  Problem Relation Age of Onset  . Coronary artery disease Brother     24 MI and 45 years old  . Coronary artery disease Mother   . Coronary artery disease Father   . Stroke Mother   . Diabetes Mother     type 2  . Diabetes Father     type 2    History  Substance Use Topics  . Smoking status: Never Smoker   . Smokeless tobacco: Never Used  .  Alcohol Use: No      Review of Systems  All other systems reviewed and are negative.    Allergies  Bee venom; Meloxicam; Penicillins; Sulfa antibiotics; and Lortab  Home Medications   Current Outpatient Rx  Name  Route  Sig  Dispense  Refill  . ALBUTEROL SULFATE HFA 108 (90 BASE) MCG/ACT IN AERS   Inhalation   Inhale 2 puffs into the lungs every 6 (six) hours as needed. For shortness of breath         . ASPIRIN EC 81 MG PO TBEC   Oral   Take 81 mg by mouth daily.         Marland Kitchen EPINEPHRINE 0.15 MG/0.3ML IJ DEVI   Intramuscular   Inject 0.15 mg into the muscle daily as needed. For severe allergic reaction         . LACOSAMIDE 100 MG PO TABS   Oral   Take 200 mg by mouth 2 (two) times daily.          Marland Kitchen LISINOPRIL-HYDROCHLOROTHIAZIDE 20-12.5 MG PO TABS   Oral   Take 1 tablet by mouth daily.          Marland Kitchen  LORATADINE 10 MG PO TABS   Oral   Take 10 mg by mouth daily.           . MULTI-VITAMIN/MINERALS PO TABS   Oral   Take 1 tablet by mouth 2 (two) times daily.          Marland Kitchen OMEPRAZOLE 20 MG PO CPDR   Oral   Take 20 mg by mouth daily.         Marland Kitchen PHENYTOIN SODIUM EXTENDED 100 MG PO CAPS   Oral   Take 200 mg by mouth 2 (two) times daily.          . QUETIAPINE FUMARATE ER 50 MG PO TB24   Oral   Take 50 mg by mouth daily.         Marland Kitchen VITAMIN C 500 MG PO TABS   Oral   Take 500 mg by mouth daily.         Marland Kitchen ZOLMITRIPTAN 2.5 MG PO TABS   Oral   Take 2.5 mg by mouth as needed. Migraine         . CLINDAMYCIN HCL 150 MG PO CAPS   Oral   Take 2 capsules (300 mg total) by mouth 3 (three) times daily. May dispense as 150mg  capsules   60 capsule   0   . CLINDAMYCIN HCL 300 MG PO CAPS   Oral   Take 1 capsule (300 mg total) by mouth 4 (four) times daily. X 7 days   28 capsule   0   . OXYCODONE-ACETAMINOPHEN 5-325 MG PO TABS   Oral   Take 1 tablet by mouth every 4 (four) hours as needed for pain.   20 tablet   0   . OXYCODONE-ACETAMINOPHEN 5-325 MG  PO TABS   Oral   Take 1-2 tablets by mouth every 6 (six) hours as needed for pain.   20 tablet   0     BP 148/77  Pulse 71  Temp 97.9 F (36.6 C) (Oral)  Resp 16  Ht 5\' 9"  (1.753 m)  Wt 245 lb (111.131 kg)  BMI 36.18 kg/m2  SpO2 98%  Physical Exam  Nursing note and vitals reviewed. Constitutional: He is oriented to person, place, and time. He appears well-developed and well-nourished. No distress.  HENT:  Head: Normocephalic and atraumatic.  Mouth/Throat: Oropharynx is clear and moist.       The right upper rear molar is noted to have a piece missing.  There is no gingival redness or swelling.  There is no abscess I can appreciate.    Neck: Normal range of motion. Neck supple.  Neurological: He is alert and oriented to person, place, and time.  Skin: Skin is warm and dry. He is not diaphoretic.    ED Course  Procedures (including critical care time)  Labs Reviewed - No data to display No results found.   No diagnosis found.    MDM  This is his third visit here for the same.  I will prescribe him additional pain meds but have advised him that this needs to be taken care of by a dentist and we will may be less eager to prescribe his pain meds in the future.          Geoffery Lyons, MD 05/14/12 1610  Geoffery Lyons, MD 05/14/12 [phone]

## 2012-07-23 ENCOUNTER — Emergency Department (HOSPITAL_COMMUNITY)
Admission: EM | Admit: 2012-07-23 | Discharge: 2012-07-23 | Disposition: A | Discharge status: Home / Self Care | Payer: Medicaid Other | Attending: Emergency Medicine | Admitting: Emergency Medicine

## 2012-07-23 ENCOUNTER — Emergency Department (HOSPITAL_COMMUNITY): Payer: Medicaid Other

## 2012-07-23 ENCOUNTER — Encounter (HOSPITAL_COMMUNITY): Payer: Self-pay | Admitting: *Deleted

## 2012-07-23 DIAGNOSIS — G4730 Sleep apnea, unspecified: Secondary | ICD-10-CM | POA: Insufficient documentation

## 2012-07-23 DIAGNOSIS — G43909 Migraine, unspecified, not intractable, without status migrainosus: Secondary | ICD-10-CM | POA: Insufficient documentation

## 2012-07-23 DIAGNOSIS — G9381 Temporal sclerosis: Secondary | ICD-10-CM | POA: Insufficient documentation

## 2012-07-23 DIAGNOSIS — F3289 Other specified depressive episodes: Secondary | ICD-10-CM | POA: Insufficient documentation

## 2012-07-23 DIAGNOSIS — S46912A Strain of unspecified muscle, fascia and tendon at shoulder and upper arm level, left arm, initial encounter: Secondary | ICD-10-CM

## 2012-07-23 DIAGNOSIS — Z87442 Personal history of urinary calculi: Secondary | ICD-10-CM | POA: Insufficient documentation

## 2012-07-23 DIAGNOSIS — Z8659 Personal history of other mental and behavioral disorders: Secondary | ICD-10-CM | POA: Insufficient documentation

## 2012-07-23 DIAGNOSIS — Z7982 Long term (current) use of aspirin: Secondary | ICD-10-CM | POA: Insufficient documentation

## 2012-07-23 DIAGNOSIS — Z9981 Dependence on supplemental oxygen: Secondary | ICD-10-CM | POA: Insufficient documentation

## 2012-07-23 DIAGNOSIS — J45909 Unspecified asthma, uncomplicated: Secondary | ICD-10-CM | POA: Insufficient documentation

## 2012-07-23 DIAGNOSIS — Y9389 Activity, other specified: Secondary | ICD-10-CM | POA: Insufficient documentation

## 2012-07-23 DIAGNOSIS — Z79899 Other long term (current) drug therapy: Secondary | ICD-10-CM | POA: Insufficient documentation

## 2012-07-23 DIAGNOSIS — M2550 Pain in unspecified joint: Secondary | ICD-10-CM | POA: Insufficient documentation

## 2012-07-23 DIAGNOSIS — Z8679 Personal history of other diseases of the circulatory system: Secondary | ICD-10-CM | POA: Insufficient documentation

## 2012-07-23 DIAGNOSIS — I10 Essential (primary) hypertension: Secondary | ICD-10-CM | POA: Insufficient documentation

## 2012-07-23 DIAGNOSIS — K219 Gastro-esophageal reflux disease without esophagitis: Secondary | ICD-10-CM | POA: Insufficient documentation

## 2012-07-23 DIAGNOSIS — X500XXA Overexertion from strenuous movement or load, initial encounter: Secondary | ICD-10-CM | POA: Insufficient documentation

## 2012-07-23 DIAGNOSIS — Z8719 Personal history of other diseases of the digestive system: Secondary | ICD-10-CM | POA: Insufficient documentation

## 2012-07-23 DIAGNOSIS — G40909 Epilepsy, unspecified, not intractable, without status epilepticus: Secondary | ICD-10-CM | POA: Insufficient documentation

## 2012-07-23 DIAGNOSIS — IMO0002 Reserved for concepts with insufficient information to code with codable children: Secondary | ICD-10-CM | POA: Insufficient documentation

## 2012-07-23 DIAGNOSIS — Z9861 Coronary angioplasty status: Secondary | ICD-10-CM | POA: Insufficient documentation

## 2012-07-23 DIAGNOSIS — Z8669 Personal history of other diseases of the nervous system and sense organs: Secondary | ICD-10-CM | POA: Insufficient documentation

## 2012-07-23 DIAGNOSIS — Y929 Unspecified place or not applicable: Secondary | ICD-10-CM | POA: Insufficient documentation

## 2012-07-23 MED ORDER — HYDROCODONE-ACETAMINOPHEN 5-325 MG PO TABS: 1.0000 | ORAL_TABLET | ORAL | Status: DC | PRN

## 2012-07-23 MED ORDER — IBUPROFEN 600 MG PO TABS: 600.0000 mg | ORAL_TABLET | Freq: Four times a day (QID) | ORAL | Status: DC | PRN

## 2012-07-23 MED ORDER — HYDROCODONE-ACETAMINOPHEN 5-325 MG PO TABS
1.0000 | ORAL_TABLET | Freq: Once | ORAL | Status: AC
  Administered 2012-07-23: 1 via ORAL
  Filled 2012-07-23: qty 1

## 2012-07-23 NOTE — ED Notes (Addendum)
Pt moving furniture today c/o pain from left elbow up to the back of his shoulder.
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
## 2012-07-28 NOTE — ED Provider Notes (Signed)
History     CSN: [id number]  Arrival date & time 07/23/12  1900   First MD Initiated Contact with Patient 07/23/12 1926      Chief Complaint  Patient presents with  . Shoulder Pain    (Consider location/radiation/quality/duration/timing/severity/associated sxs/prior treatment) HPI Comments: Chris Spencer is a 32 y.o. Male presenting with left shoulder pain which has been persistent and worse since he felt a "pop" in the joint when trying to move a [place] cabinet today.  Pain is worse with range of motion as it radiates to his shoulder blade and his elbow with movement.  He denies numbness in the arm and has noted no forearm or wrist and hand weakness.  He has taken no medicines prior to arrival.       The history is provided by the patient.    Past Medical History  Diagnosis Date  . Diverticulosis        . Hypertension   . Seizures     Event recorder May, 2013, no significant abnormalities  . Asthma   . Migraines   . Depression   . Mood swings   . Chest pain with normal coronary angiography     a.  09/2011 Cath:  Normal Cors, NL LV fxn  . Sleep apnea     a. On CPAP  . Kidney stones   . GERD (gastroesophageal reflux disease)   . Ejection fraction     Normal LV function, catheterization, May, 2013  . Palpitations     event recorder, June, 2013, normal sinus rhythm,  . Mesial temporal sclerosis     Diagnosis per neurology, September, 2013    Past Surgical History  Procedure Laterality Date  . Hernia repair    . Cholecystectomy    . Tonsillectomy    . Neck surgery    . Wrist fusion  left  . Cardiac catheterization      Family History  Problem Relation Age of Onset  . Coronary artery disease Brother     49 MI and 40 years old  . Coronary artery disease Mother   . Coronary artery disease Father   . Stroke Mother   . Diabetes Mother     type 2  . Diabetes Father     type 2    History  Substance Use Topics  . Smoking status: Never Smoker   . Smokeless  tobacco: Never Used  . Alcohol Use: No      Review of Systems  Musculoskeletal: Positive for arthralgias. Negative for myalgias and joint swelling.  Skin: Negative for wound.  Neurological: Negative for weakness and numbness.    Allergies  Bee venom; Meloxicam; Penicillins; and Sulfa antibiotics  Home Medications   Current Outpatient Rx  Name  Route  Sig  Dispense  Refill  . aspirin EC 81 MG tablet   Oral   Take 81 mg by mouth daily.         . Lacosamide (VIMPAT) 100 MG TABS   Oral   Take 100 mg by mouth daily.          Marland Kitchen lisinopril-hydrochlorothiazide (PRINZIDE,ZESTORETIC) 20-12.5 MG per tablet   Oral   Take 1 tablet by mouth daily.          Marland Kitchen loratadine (CLARITIN) 10 MG tablet   Oral   Take 10 mg by mouth daily.           Marland Kitchen omeprazole (PRILOSEC) 20 MG capsule   Oral   Take 20 mg  by mouth daily.         . phenytoin (DILANTIN) 100 MG ER capsule   Oral   Take 200 mg by mouth 2 (two) times daily.          . QUEtiapine (SEROQUEL XR) 50 MG TB24   Oral   Take 50 mg by mouth at bedtime.          . vitamin C (ASCORBIC ACID) 500 MG tablet   Oral   Take 500 mg by mouth daily.         Marland Kitchen ZOLMitriptan (ZOMIG) 2.5 MG tablet   Oral   Take 2.5 mg by mouth as needed. Migraine         . albuterol (PROVENTIL HFA;VENTOLIN HFA) 108 (90 BASE) MCG/ACT inhaler   Inhalation   Inhale 2 puffs into the lungs every 6 (six) hours as needed. For shortness of breath         . EPINEPHrine (EPIPEN JR) 0.15 MG/0.3ML injection   Intramuscular   Inject 0.15 mg into the muscle daily as needed. For severe allergic reaction         . HYDROcodone-acetaminophen (NORCO/VICODIN) 5-325 MG per tablet   Oral   Take 1 tablet by mouth every 4 (four) hours as needed for pain.   20 tablet   0   . ibuprofen (ADVIL,MOTRIN) 600 MG tablet   Oral   Take 1 tablet (600 mg total) by mouth every 6 (six) hours as needed for pain.   30 tablet   0     BP 151/82  Pulse 86   Temp(Src) 98.1 F (36.7 C) (Oral)  Resp 20  Ht 5\' 9"  (1.753 m)  Wt 245 lb (111.131 kg)  BMI 36.16 kg/m2  SpO2 99%  Physical Exam  Constitutional: He appears well-developed and well-nourished.  HENT:  Head: Atraumatic.  Neck: Normal range of motion.  Cardiovascular:  Pulses equal bilaterally  Musculoskeletal: He exhibits tenderness.       Left shoulder: He exhibits bony tenderness. He exhibits no effusion, no crepitus, no deformity, no spasm, normal pulse and normal strength.  Tender at lateral proximal humerus.    Neurological: He is alert. He has normal strength. He displays normal reflexes. No sensory deficit.  Equal strength  Skin: Skin is warm and dry.  Psychiatric: He has a normal mood and affect.    ED Course  Procedures (including critical care time)  Labs Reviewed - No data to display No results found.   1. Shoulder strain, left, initial encounter       MDM  Pt placed in sling for comfort.  Encouraged ice, ibuprofen,  Also prescribed hydrocodone.  Recheck if not improved over the next week.  Xray reviewed and negative,  Discussed with patient.  Probable simple strain,  Cannot rule out rotator injury.        Burgess Amor, [place] 07/28/12 [phone]

## 2012-07-28 NOTE — ED Provider Notes (Signed)
Medical screening examination/treatment/procedure(s) were performed by non-physician practitioner and as supervising physician I was immediately available for consultation/collaboration.   Benny Lennert, MD 07/28/12 [phone]

## 2012-08-22 ENCOUNTER — Encounter (HOSPITAL_COMMUNITY): Payer: Self-pay

## 2012-08-22 ENCOUNTER — Emergency Department (HOSPITAL_COMMUNITY)
Admission: EM | Admit: 2012-08-22 | Discharge: 2012-08-22 | Disposition: A | Discharge status: Home / Self Care | Payer: Medicaid Other | Attending: Emergency Medicine | Admitting: Emergency Medicine

## 2012-08-22 DIAGNOSIS — J45909 Unspecified asthma, uncomplicated: Secondary | ICD-10-CM | POA: Insufficient documentation

## 2012-08-22 DIAGNOSIS — R109 Unspecified abdominal pain: Secondary | ICD-10-CM | POA: Insufficient documentation

## 2012-08-22 DIAGNOSIS — Z9089 Acquired absence of other organs: Secondary | ICD-10-CM | POA: Insufficient documentation

## 2012-08-22 DIAGNOSIS — Z9861 Coronary angioplasty status: Secondary | ICD-10-CM | POA: Insufficient documentation

## 2012-08-22 DIAGNOSIS — F329 Major depressive disorder, single episode, unspecified: Secondary | ICD-10-CM | POA: Insufficient documentation

## 2012-08-22 DIAGNOSIS — Z79899 Other long term (current) drug therapy: Secondary | ICD-10-CM | POA: Insufficient documentation

## 2012-08-22 DIAGNOSIS — G43909 Migraine, unspecified, not intractable, without status migrainosus: Secondary | ICD-10-CM | POA: Insufficient documentation

## 2012-08-22 DIAGNOSIS — Z87442 Personal history of urinary calculi: Secondary | ICD-10-CM | POA: Insufficient documentation

## 2012-08-22 DIAGNOSIS — Z8719 Personal history of other diseases of the digestive system: Secondary | ICD-10-CM | POA: Insufficient documentation

## 2012-08-22 DIAGNOSIS — F3289 Other specified depressive episodes: Secondary | ICD-10-CM | POA: Insufficient documentation

## 2012-08-22 DIAGNOSIS — Z8669 Personal history of other diseases of the nervous system and sense organs: Secondary | ICD-10-CM | POA: Insufficient documentation

## 2012-08-22 DIAGNOSIS — Z8673 Personal history of transient ischemic attack (TIA), and cerebral infarction without residual deficits: Secondary | ICD-10-CM | POA: Insufficient documentation

## 2012-08-22 DIAGNOSIS — G40909 Epilepsy, unspecified, not intractable, without status epilepticus: Secondary | ICD-10-CM | POA: Insufficient documentation

## 2012-08-22 DIAGNOSIS — Z8659 Personal history of other mental and behavioral disorders: Secondary | ICD-10-CM | POA: Insufficient documentation

## 2012-08-22 DIAGNOSIS — R110 Nausea: Secondary | ICD-10-CM | POA: Insufficient documentation

## 2012-08-22 DIAGNOSIS — Z7982 Long term (current) use of aspirin: Secondary | ICD-10-CM | POA: Insufficient documentation

## 2012-08-22 DIAGNOSIS — R42 Dizziness and giddiness: Secondary | ICD-10-CM | POA: Insufficient documentation

## 2012-08-22 DIAGNOSIS — I10 Essential (primary) hypertension: Secondary | ICD-10-CM | POA: Insufficient documentation

## 2012-08-22 DIAGNOSIS — K219 Gastro-esophageal reflux disease without esophagitis: Secondary | ICD-10-CM | POA: Insufficient documentation

## 2012-08-22 DIAGNOSIS — R51 Headache: Secondary | ICD-10-CM

## 2012-08-22 LAB — CBC WITH DIFFERENTIAL/PLATELET
Basophils Absolute: 0 10*3/uL (ref 0.0–0.1)
Eosinophils Absolute: 0.4 10*3/uL (ref 0.0–0.7)
Eosinophils Relative: 6 % — ABNORMAL HIGH (ref 0–5)
MCH: 30 pg (ref 26.0–34.0)
MCHC: 35.5 g/dL (ref 30.0–36.0)
MCV: 84.5 fL (ref 78.0–100.0)
Platelets: 208 10*3/uL (ref 150–400)
RDW: 13 % (ref 11.5–15.5)
WBC: 6.7 10*3/uL (ref 4.0–10.5)

## 2012-08-22 LAB — COMPREHENSIVE METABOLIC PANEL
ALT: 60 U/L — ABNORMAL HIGH (ref 0–53)
AST: 31 U/L (ref 0–37)
Calcium: 10.1 mg/dL (ref 8.4–10.5)
GFR calc Af Amer: >90 mL/min (ref >90)
Sodium: 135 mEq/L (ref 135–145)
Total Protein: 7.4 g/dL (ref 6.0–8.3)

## 2012-08-22 MED ORDER — KETOROLAC TROMETHAMINE 30 MG/ML IJ SOLN
30.0000 mg | Freq: Once | INTRAMUSCULAR | Status: AC
  Administered 2012-08-22: 30 mg via INTRAVENOUS
  Filled 2012-08-22: qty 1

## 2012-08-22 MED ORDER — ONDANSETRON HCL 4 MG/2ML IJ SOLN
4.0000 mg | Freq: Once | INTRAMUSCULAR | Status: AC
  Administered 2012-08-22: 4 mg via INTRAVENOUS
  Filled 2012-08-22: qty 2

## 2012-08-22 MED ORDER — DIPHENHYDRAMINE HCL 50 MG/ML IJ SOLN
25.0000 mg | Freq: Once | INTRAMUSCULAR | Status: AC
  Administered 2012-08-22: 25 mg via INTRAVENOUS
  Filled 2012-08-22: qty 1

## 2012-08-22 MED ORDER — KETOROLAC TROMETHAMINE 60 MG/2ML IM SOLN: 60.0000 mg | Freq: Once | INTRAMUSCULAR | Status: DC

## 2012-08-22 MED ORDER — SODIUM CHLORIDE 0.9 % IV BOLUS (SEPSIS)
1000.0000 mL | Freq: Once | INTRAVENOUS | Status: AC
  Administered 2012-08-22: 1000 mL via INTRAVENOUS

## 2012-08-22 MED ORDER — ONDANSETRON 4 MG PO TBDP: 4.0000 mg | ORAL_TABLET | Freq: Three times a day (TID) | ORAL | Status: DC | PRN

## 2012-08-22 NOTE — ED Provider Notes (Deleted)
History     CSN: [id number]  Arrival date & time 08/22/12  0007   First MD Initiated Contact with Patient 08/22/12 0145      Chief Complaint  Patient presents with  . Abdominal Pain  . Nausea  . Dizziness    (Consider location/radiation/quality/duration/timing/severity/associated sxs/prior treatment) HPI  Past Medical History  Diagnosis Date  . Diverticulosis        . Hypertension   . Seizures     Event recorder May, 2013, no significant abnormalities  . Asthma   . Migraines   . Depression   . Mood swings   . Chest pain with normal coronary angiography     a.  09/2011 Cath:  Normal Cors, NL LV fxn  . Sleep apnea     a. On CPAP  . Kidney stones   . GERD (gastroesophageal reflux disease)   . Ejection fraction     Normal LV function, catheterization, May, 2013  . Palpitations     event recorder, June, 2013, normal sinus rhythm,  . Mesial temporal sclerosis     Diagnosis per neurology, September, 2013    Past Surgical History  Procedure Laterality Date  . Hernia repair    . Cholecystectomy    . Tonsillectomy    . Neck surgery    . Wrist fusion  left  . Cardiac catheterization      Family History  Problem Relation Age of Onset  . Coronary artery disease Brother     43 MI and 44 years old  . Coronary artery disease Mother   . Coronary artery disease Father   . Stroke Mother   . Diabetes Mother     type 2  . Diabetes Father     type 2    History  Substance Use Topics  . Smoking status: Never Smoker   . Smokeless tobacco: Never Used  . Alcohol Use: No      Review of Systems  Allergies  Bee venom; Meloxicam; Penicillins; and Sulfa antibiotics  Home Medications   Current Outpatient Rx  Name  Route  Sig  Dispense  Refill  . albuterol (PROVENTIL HFA;VENTOLIN HFA) 108 (90 BASE) MCG/ACT inhaler   Inhalation   Inhale 2 puffs into the lungs every 6 (six) hours as needed. For shortness of breath         . aspirin EC 81 MG tablet   Oral   Take  81 mg by mouth daily.         Marland Kitchen EPINEPHrine (EPIPEN JR) 0.15 MG/0.3ML injection   Intramuscular   Inject 0.15 mg into the muscle daily as needed. For severe allergic reaction         . lisinopril-hydrochlorothiazide (PRINZIDE,ZESTORETIC) 20-12.5 MG per tablet   Oral   Take 1 tablet by mouth daily.          Marland Kitchen loratadine (CLARITIN) 10 MG tablet   Oral   Take 10 mg by mouth daily.           Marland Kitchen omeprazole (PRILOSEC) 20 MG capsule   Oral   Take 20 mg by mouth daily.         . QUEtiapine (SEROQUEL XR) 50 MG TB24   Oral   Take 50 mg by mouth at bedtime.          . vitamin C (ASCORBIC ACID) 500 MG tablet   Oral   Take 500 mg by mouth daily.         Marland Kitchen ZOLMitriptan (ZOMIG)  2.5 MG tablet   Oral   Take 2.5 mg by mouth as needed. Migraine         . HYDROcodone-acetaminophen (NORCO/VICODIN) 5-325 MG per tablet   Oral   Take 1 tablet by mouth every 4 (four) hours as needed for pain.   20 tablet   0   . ibuprofen (ADVIL,MOTRIN) 600 MG tablet   Oral   Take 1 tablet (600 mg total) by mouth every 6 (six) hours as needed for pain.   30 tablet   0   . Lacosamide (VIMPAT) 100 MG TABS   Oral   Take 100 mg by mouth daily.          . phenytoin (DILANTIN) 100 MG ER capsule   Oral   Take 200 mg by mouth 2 (two) times daily.            There were no vitals taken for this visit.  Physical Exam  ED Course  Procedures (including critical care time)  Results for orders placed during the hospital encounter of 08/22/12  CBC WITH DIFFERENTIAL      Result Value Range   WBC 6.7  4.0 - 10.5 K/uL   RBC 4.84  4.22 - 5.81 MIL/uL   Hemoglobin 14.5  13.0 - 17.0 g/dL   HCT 16.1  09.6 - 04.5 %   MCV 84.5  78.0 - 100.0 fL   MCH 30.0  26.0 - 34.0 pg   MCHC 35.5  30.0 - 36.0 g/dL   RDW 40.9  81.1 - 91.4 %   Platelets 208  150 - 400 K/uL   Neutrophils Relative 48  43 - 77 %   Neutro Abs 3.2  1.7 - 7.7 K/uL   Lymphocytes Relative 36  12 - 46 %   Lymphs Abs 2.4  0.7 - 4.0  K/uL   Monocytes Relative 9  3 - 12 %   Monocytes Absolute 0.6  0.1 - 1.0 K/uL   Eosinophils Relative 6 (*) 0 - 5 %   Eosinophils Absolute 0.4  0.0 - 0.7 K/uL   Basophils Relative 0  0 - 1 %   Basophils Absolute 0.0  0.0 - 0.1 K/uL  COMPREHENSIVE METABOLIC PANEL      Result Value Range   Sodium 135  135 - 145 mEq/L   Potassium 3.8  3.5 - 5.1 mEq/L   Chloride 98  96 - 112 mEq/L   CO2 23  19 - 32 mEq/L   Glucose, Bld 97  70 - 99 mg/dL   BUN 12  6 - 23 mg/dL   Creatinine, Ser 7.82  0.50 - 1.35 mg/dL   Calcium 95.6  8.4 - 21.3 mg/dL   Total Protein 7.4  6.0 - 8.3 g/dL   Albumin 4.3  3.5 - 5.2 g/dL   AST 31  0 - 37 U/L   ALT 60 (*) 0 - 53 U/L   Alkaline Phosphatase 109  39 - 117 U/L   Total Bilirubin 0.3  0.3 - 1.2 mg/dL   GFR calc non Af Amer >90  >90 mL/min   GFR calc Af Amer >90  >90 mL/min      MDM  Patietn with abdominal pain and nausea concerned that he may have gotten into bad Pepsi while at a rally. Given toradol for pain and zofran for nausea. Labs normal. Reviewed results with patient. Pt stable in ED with no significant deterioration in condition.The patient appears reasonably screened and/or stabilized for discharge and  I doubt any other medical condition or other  [facility] requiring further screening, evaluation, or treatment in the ED at this time prior to discharge.  MDM Reviewed: nursing note and vitals Interpretation: labs           Nicoletta Dress. Colon Branch, MD 08/22/12 [id number]

## 2012-08-22 NOTE — ED Notes (Signed)
Pt states he bought a Pepsi Max at [employer] in [place] approx 12 noon on Sunday,  States about 1-2 hours after drinking the drink he started feeling sick, also thought the drink tasted and smelled funny.  Pt is now dizzy and nauseated and having abd pain.  Pt states the [place] police took a sample of the drink for possible testing.

## 2012-08-22 NOTE — ED Provider Notes (Addendum)
History     CSN: [id number]  Arrival date & time 08/22/12  0007   First MD Initiated Contact with Patient 08/22/12 0145      Chief Complaint  Patient presents with  . Abdominal Pain  . Nausea  . Dizziness    (Consider location/radiation/quality/duration/timing/severity/associated sxs/prior treatment) HPI Chris Spencer is a 32 y.o. male who presents to the Emergency Department complaining of abdominal pain, nausea, headache. He was at a rally and stopped at [facility] to get a burger. He had bought a pepsi at a gas station first. He ate his burger and when he got home he continued to have a taste of oil/grease in his mouth. He was convinced that the soda was the cause. His wife called EMS to check him out. They could find nothing. His father told him that the soda smelled like antifreeze.He decided to come to the ER to be checked. His has a stomach ache now and feels nauseated. He has a headache. He has had no vomiting or diarrhea.  PCP Dr. Olena Leatherwood Past Medical History  Diagnosis Date  . Diverticulosis        . Hypertension   . Seizures     Event recorder May, 2013, no significant abnormalities  . Asthma   . Migraines   . Depression   . Mood swings   . Chest pain with normal coronary angiography     a.  09/2011 Cath:  Normal Cors, NL LV fxn  . Sleep apnea     a. On CPAP  . Kidney stones   . GERD (gastroesophageal reflux disease)   . Ejection fraction     Normal LV function, catheterization, May, 2013  . Palpitations     event recorder, June, 2013, normal sinus rhythm,  . Mesial temporal sclerosis     Diagnosis per neurology, September, 2013    Past Surgical History  Procedure Laterality Date  . Hernia repair    . Cholecystectomy    . Tonsillectomy    . Neck surgery    . Wrist fusion  left  . Cardiac catheterization      Family History  Problem Relation Age of Onset  . Coronary artery disease Brother     66 MI and 92 years old  . Coronary artery disease Mother    . Coronary artery disease Father   . Stroke Mother   . Diabetes Mother     type 2  . Diabetes Father     type 2    History  Substance Use Topics  . Smoking status: Never Smoker   . Smokeless tobacco: Never Used  . Alcohol Use: No      Review of Systems  Constitutional: Negative for fever.       10 Systems reviewed and are negative for acute change except as noted in the HPI.  HENT: Negative for congestion.   Eyes: Negative for discharge and redness.  Respiratory: Negative for cough and shortness of breath.   Cardiovascular: Negative for chest pain.  Gastrointestinal: Negative for vomiting and abdominal pain.  Musculoskeletal: Negative for back pain.  Skin: Negative for rash.  Neurological: Positive for headaches. Negative for syncope and numbness.  Psychiatric/Behavioral:       No behavior change.    Allergies  Bee venom; Meloxicam; Penicillins; and Sulfa antibiotics  Home Medications   Current Outpatient Rx  Name  Route  Sig  Dispense  Refill  . albuterol (PROVENTIL HFA;VENTOLIN HFA) 108 (90 BASE) MCG/ACT inhaler  Inhalation   Inhale 2 puffs into the lungs every 6 (six) hours as needed. For shortness of breath         . aspirin EC 81 MG tablet   Oral   Take 81 mg by mouth daily.         Marland Kitchen EPINEPHrine (EPIPEN JR) 0.15 MG/0.3ML injection   Intramuscular   Inject 0.15 mg into the muscle daily as needed. For severe allergic reaction         . lisinopril-hydrochlorothiazide (PRINZIDE,ZESTORETIC) 20-12.5 MG per tablet   Oral   Take 1 tablet by mouth daily.          Marland Kitchen loratadine (CLARITIN) 10 MG tablet   Oral   Take 10 mg by mouth daily.           Marland Kitchen omeprazole (PRILOSEC) 20 MG capsule   Oral   Take 20 mg by mouth daily.         . QUEtiapine (SEROQUEL XR) 50 MG TB24   Oral   Take 50 mg by mouth at bedtime.          . vitamin C (ASCORBIC ACID) 500 MG tablet   Oral   Take 500 mg by mouth daily.         Marland Kitchen ZOLMitriptan (ZOMIG) 2.5 MG  tablet   Oral   Take 2.5 mg by mouth as needed. Migraine         . HYDROcodone-acetaminophen (NORCO/VICODIN) 5-325 MG per tablet   Oral   Take 1 tablet by mouth every 4 (four) hours as needed for pain.   20 tablet   0   . ibuprofen (ADVIL,MOTRIN) 600 MG tablet   Oral   Take 1 tablet (600 mg total) by mouth every 6 (six) hours as needed for pain.   30 tablet   0   . Lacosamide (VIMPAT) 100 MG TABS   Oral   Take 100 mg by mouth daily.          . phenytoin (DILANTIN) 100 MG ER capsule   Oral   Take 200 mg by mouth 2 (two) times daily.            There were no vitals taken for this visit.  Physical Exam  Nursing note and vitals reviewed. Constitutional: He appears well-developed and well-nourished.  Awake, alert, nontoxic appearance.  HENT:  Head: Normocephalic and atraumatic.  Right Ear: External ear normal.  Left Ear: External ear normal.  Eyes: EOM are normal. Pupils are equal, round, and reactive to light. Right eye exhibits no discharge. Left eye exhibits no discharge.  Neck: Neck supple.  Cardiovascular: Normal rate and intact distal pulses.   Pulmonary/Chest: Effort normal and breath sounds normal. He exhibits no tenderness.  Abdominal: Soft. Bowel sounds are normal. There is no tenderness. There is no rebound.  Musculoskeletal: He exhibits no tenderness.  Baseline ROM, no obvious new focal weakness.  Neurological:  Mental status and motor strength appears baseline for patient and situation.  Skin: No rash noted.  Psychiatric: He has a normal mood and affect.    ED Course  Procedures (including critical care time)  Results for orders placed during the hospital encounter of 08/22/12  CBC WITH DIFFERENTIAL      Result Value Range   WBC 6.7  4.0 - 10.5 K/uL   RBC 4.84  4.22 - 5.81 MIL/uL   Hemoglobin 14.5  13.0 - 17.0 g/dL   HCT 21.3  08.6 - 57.8 %   MCV  84.5  78.0 - 100.0 fL   MCH 30.0  26.0 - 34.0 pg   MCHC 35.5  30.0 - 36.0 g/dL   RDW 81.1  91.4  - 78.2 %   Platelets 208  150 - 400 K/uL   Neutrophils Relative 48  43 - 77 %   Neutro Abs 3.2  1.7 - 7.7 K/uL   Lymphocytes Relative 36  12 - 46 %   Lymphs Abs 2.4  0.7 - 4.0 K/uL   Monocytes Relative 9  3 - 12 %   Monocytes Absolute 0.6  0.1 - 1.0 K/uL   Eosinophils Relative 6 (*) 0 - 5 %   Eosinophils Absolute 0.4  0.0 - 0.7 K/uL   Basophils Relative 0  0 - 1 %   Basophils Absolute 0.0  0.0 - 0.1 K/uL  COMPREHENSIVE METABOLIC PANEL      Result Value Range   Sodium 135  135 - 145 mEq/L   Potassium 3.8  3.5 - 5.1 mEq/L   Chloride 98  96 - 112 mEq/L   CO2 23  19 - 32 mEq/L   Glucose, Bld 97  70 - 99 mg/dL   BUN 12  6 - 23 mg/dL   Creatinine, Ser 9.56  0.50 - 1.35 mg/dL   Calcium 21.3  8.4 - 08.6 mg/dL   Total Protein 7.4  6.0 - 8.3 g/dL   Albumin 4.3  3.5 - 5.2 g/dL   AST 31  0 - 37 U/L   ALT 60 (*) 0 - 53 U/L   Alkaline Phosphatase 109  39 - 117 U/L   Total Bilirubin 0.3  0.3 - 1.2 mg/dL   GFR calc non Af Amer >90  >90 mL/min   GFR calc Af Amer >90  >90 mL/min      MDM  Patient with nausea and headache after being concerned he was poisoned by a burger and Peps[employer]. Labs are normal. Reviewed labs with patient. Given zofran and toradol. Pt stable in ED with no significant deterioration in condition.The patient appears reasonably screened and/or stabilized for discharge and I doubt any other medical condition or other [facility] requiring further screening, evaluation, or treatment in the ED at this time prior to discharge.  MDM Reviewed: nursing note and vitals Interpretation: labs           Nicoletta Dress. Colon Branch, MD 08/22/12 [id number]  Nicoletta Dress. Colon Branch, MD 08/22/12 [phone]

## 2012-09-24 DIAGNOSIS — R079 Chest pain, unspecified: Secondary | ICD-10-CM

## 2013-02-20 ENCOUNTER — Other Ambulatory Visit: Payer: Self-pay | Admitting: Urology

## 2013-02-20 DIAGNOSIS — N3020 Other chronic cystitis without hematuria: Secondary | ICD-10-CM

## 2013-02-22 ENCOUNTER — Ambulatory Visit
Admission: RE | Admit: 2013-02-22 | Discharge: 2013-02-22 | Disposition: A | Discharge status: Home / Self Care | Payer: Medicaid Other | Source: Ambulatory Visit | Attending: Urology | Admitting: Urology

## 2013-02-22 DIAGNOSIS — N3020 Other chronic cystitis without hematuria: Secondary | ICD-10-CM

## 2013-02-27 IMAGING — CT CT ABD-PELV W/O CM
2 of 4 series · 16 of 46 positions shown, 18 images · non-contrast
Comparison: CT of the abdomen and pelvis performed 09/22/2011

CLINICAL DATA: Right flank pain and dysuria.

CT ABDOMEN AND PELVIS WITHOUT CONTRAST
TECHNIQUE: Multidetector CT imaging of the abdomen and pelvis was
performed following the standard protocol without intravenous
contrast.

[Series 2: standard/full over (age)lbs 5.0 · axial · 0.81mm/px · z∈[-497,-17]mm · 13 of 104 slices shown, 15 images]
[im 4/104  soft-tissue]
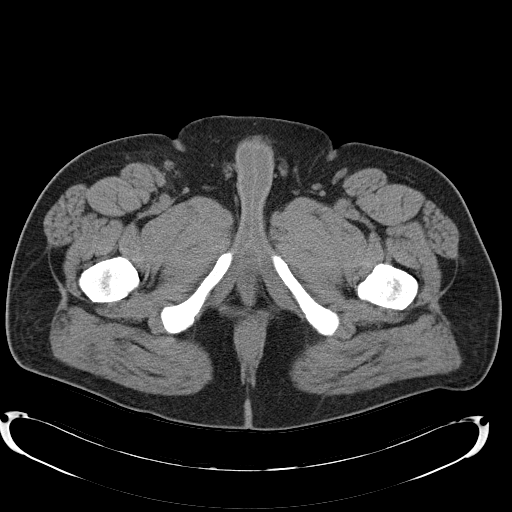
[im 4/104  bone]
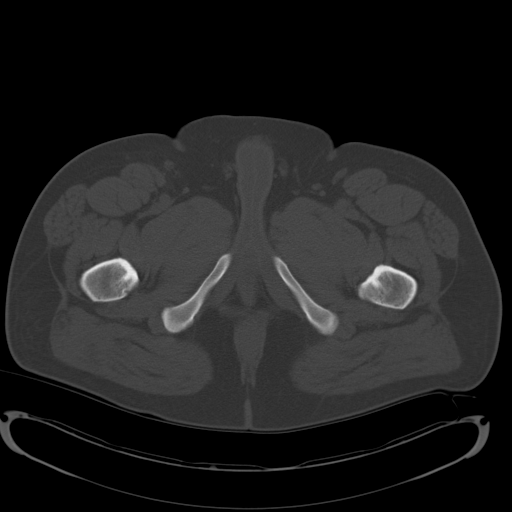
[im 12/104  soft-tissue]
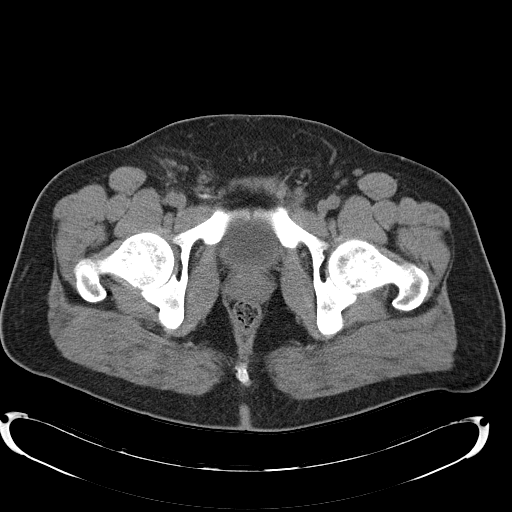
[im 20/104  soft-tissue]
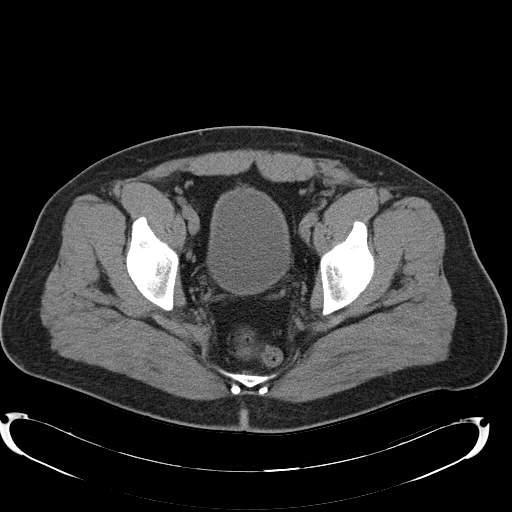
[im 28/104  soft-tissue]
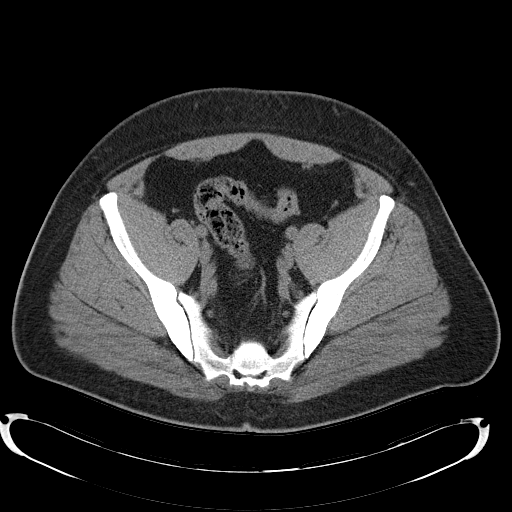
[im 36/104  soft-tissue]
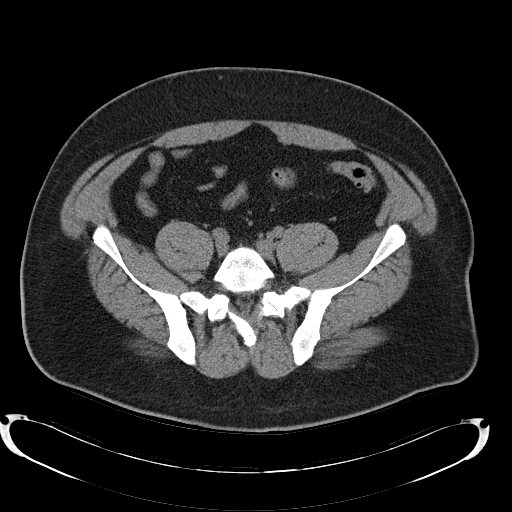
[im 44/104  soft-tissue]
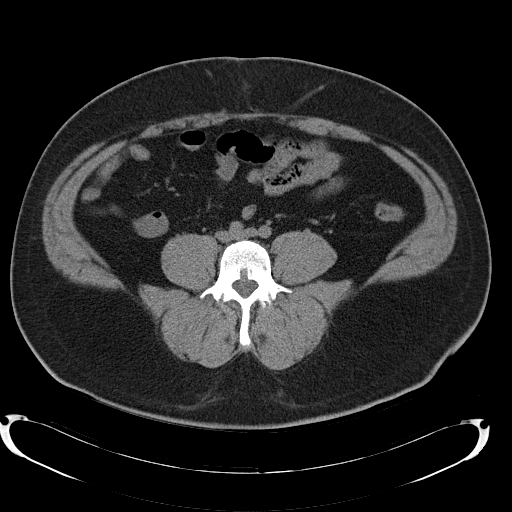
[im 52/104  soft-tissue]
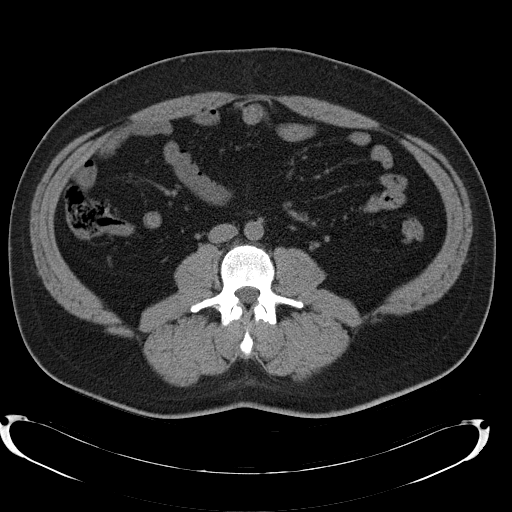
[im 60/104  soft-tissue]
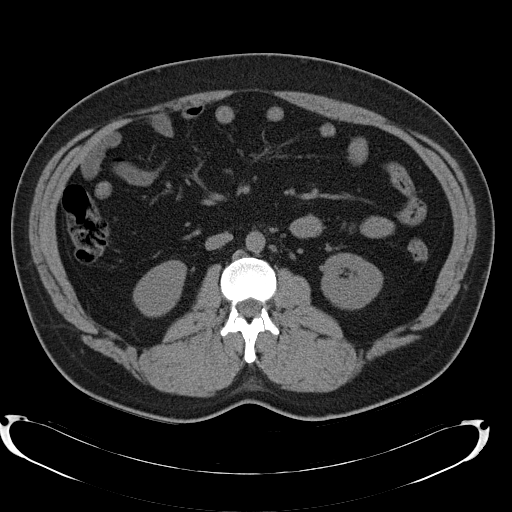
[im 68/104  soft-tissue]
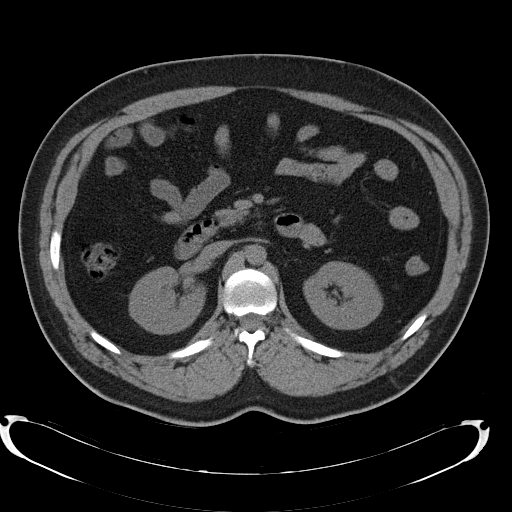
[im 68/104  bone]
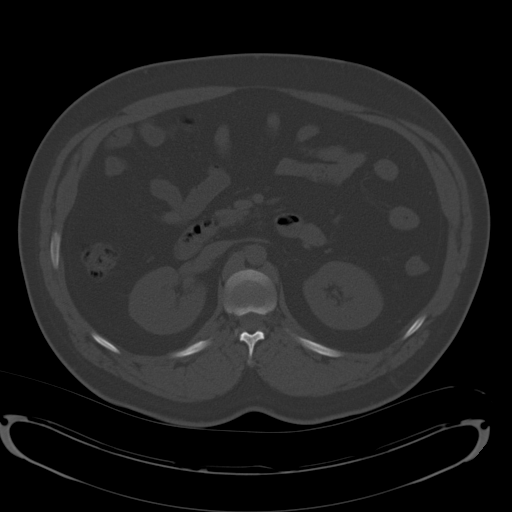
[im 76/104  soft-tissue]
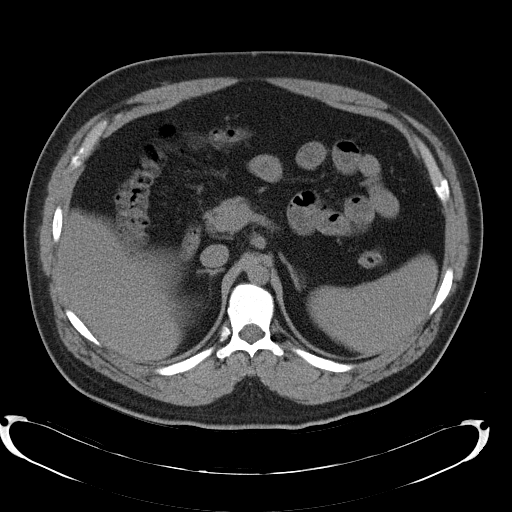
[im 84/104  soft-tissue]
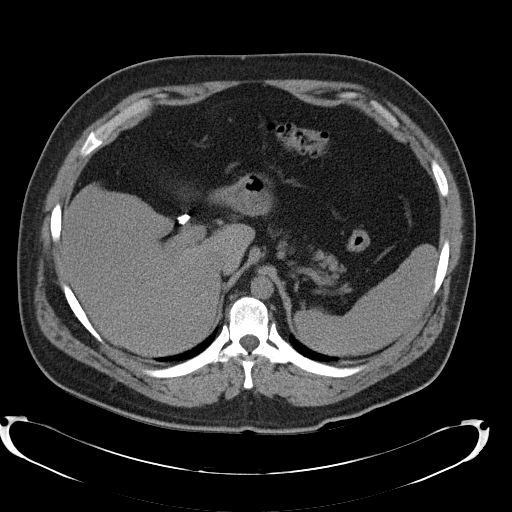
[im 92/104  soft-tissue]
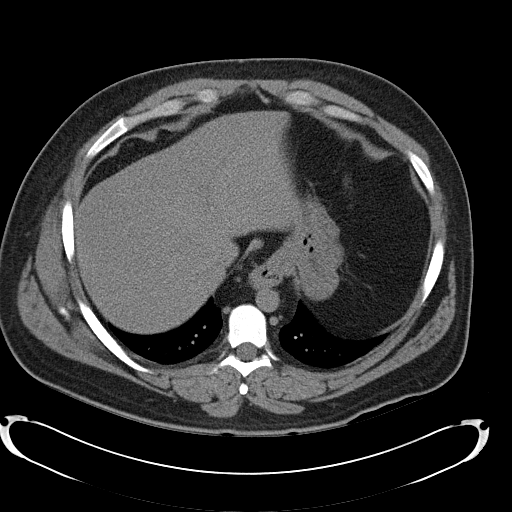
[im 100/104  soft-tissue]
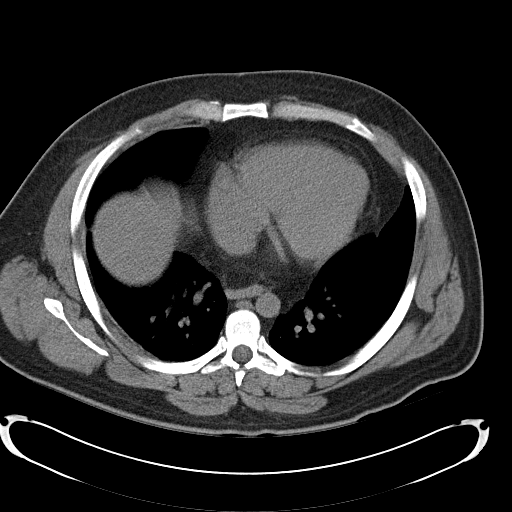

[Series 4: mpr coronal · coronal · 0.81mm/px · 3 of 103 slices shown]
[im 35/103  soft-tissue]
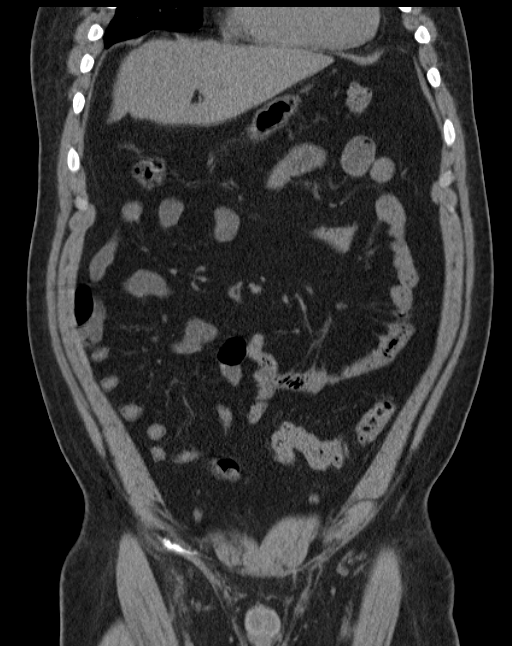
[im 46/103  soft-tissue]
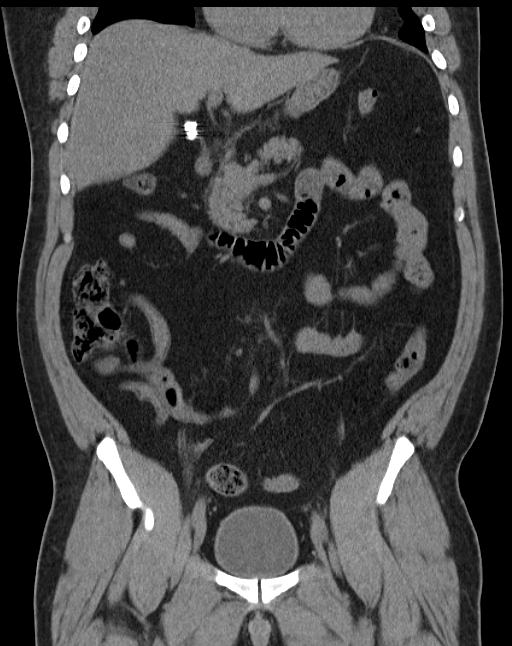
[im 57/103  soft-tissue]
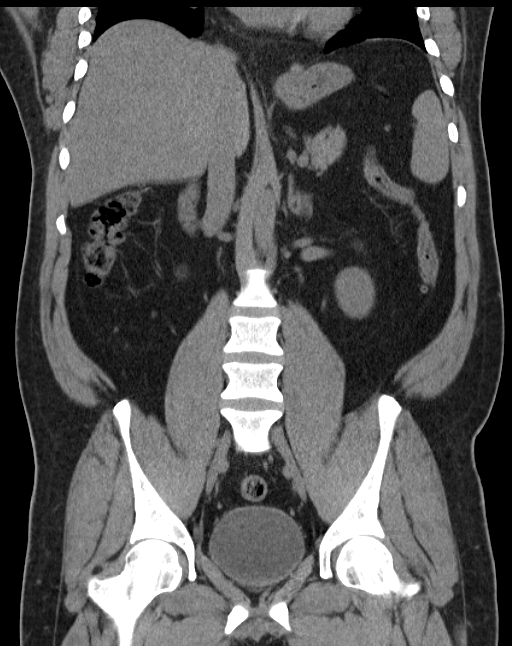

[16 of 46 positions shown; findings below may reference images not displayed]

FINDINGS: The visualized lung bases are clear.

The liver and spleen are unremarkable in appearance.  The patient
is status post cholecystectomy, with clips noted at the gallbladder
fossa.  The pancreas and adrenal glands are unremarkable.

The kidneys are unremarkable in appearance.  There is no evidence
of hydronephrosis.  No renal or ureteral stones are seen.  No
perinephric stranding is appreciated.

No free fluid is identified.  The small bowel is unremarkable in
appearance.  The stomach is within normal limits.  No acute
vascular abnormalities are seen.

The appendix is normal in caliber, without evidence for
appendicitis.  Minimal diverticulosis is noted along the proximal
sigmoid colon; the colon is otherwise unremarkable in appearance.

The bladder is mildly distended and grossly unremarkable.  The
prostate remains normal in size.  No inguinal lymphadenopathy is
seen.

No acute osseous abnormalities are identified.
IMPRESSION: 1.  No acute abnormalities seen within the abdomen or pelvis.
2.  Minimal diverticulosis along the proximal sigmoid colon; no
evidence of diverticulitis.

## 2013-05-18 ENCOUNTER — Emergency Department (HOSPITAL_COMMUNITY): Payer: Medicaid Other

## 2013-05-18 ENCOUNTER — Encounter (HOSPITAL_COMMUNITY): Payer: Self-pay | Admitting: Emergency Medicine

## 2013-05-18 ENCOUNTER — Emergency Department (HOSPITAL_COMMUNITY)
Admission: EM | Admit: 2013-05-18 | Discharge: 2013-05-18 | Disposition: A | Discharge status: Home / Self Care | Payer: Medicaid Other | Attending: Emergency Medicine | Admitting: Emergency Medicine

## 2013-05-18 DIAGNOSIS — Y92009 Unspecified place in unspecified non-institutional (private) residence as the place of occurrence of the external cause: Secondary | ICD-10-CM | POA: Insufficient documentation

## 2013-05-18 DIAGNOSIS — Z7982 Long term (current) use of aspirin: Secondary | ICD-10-CM | POA: Insufficient documentation

## 2013-05-18 DIAGNOSIS — J45909 Unspecified asthma, uncomplicated: Secondary | ICD-10-CM | POA: Insufficient documentation

## 2013-05-18 DIAGNOSIS — G4730 Sleep apnea, unspecified: Secondary | ICD-10-CM | POA: Insufficient documentation

## 2013-05-18 DIAGNOSIS — S298XXA Other specified injuries of thorax, initial encounter: Secondary | ICD-10-CM | POA: Insufficient documentation

## 2013-05-18 DIAGNOSIS — Z8719 Personal history of other diseases of the digestive system: Secondary | ICD-10-CM | POA: Insufficient documentation

## 2013-05-18 DIAGNOSIS — Z95818 Presence of other cardiac implants and grafts: Secondary | ICD-10-CM | POA: Insufficient documentation

## 2013-05-18 DIAGNOSIS — S060X9A Concussion with loss of consciousness of unspecified duration, initial encounter: Secondary | ICD-10-CM | POA: Insufficient documentation

## 2013-05-18 DIAGNOSIS — S3981XA Other specified injuries of abdomen, initial encounter: Secondary | ICD-10-CM | POA: Insufficient documentation

## 2013-05-18 DIAGNOSIS — Z8659 Personal history of other mental and behavioral disorders: Secondary | ICD-10-CM | POA: Insufficient documentation

## 2013-05-18 DIAGNOSIS — K219 Gastro-esophageal reflux disease without esophagitis: Secondary | ICD-10-CM | POA: Insufficient documentation

## 2013-05-18 DIAGNOSIS — Z880 Allergy status to penicillin: Secondary | ICD-10-CM | POA: Insufficient documentation

## 2013-05-18 DIAGNOSIS — S0993XA Unspecified injury of face, initial encounter: Secondary | ICD-10-CM | POA: Insufficient documentation

## 2013-05-18 DIAGNOSIS — W11XXXA Fall on and from ladder, initial encounter: Secondary | ICD-10-CM | POA: Insufficient documentation

## 2013-05-18 DIAGNOSIS — Z79899 Other long term (current) drug therapy: Secondary | ICD-10-CM | POA: Insufficient documentation

## 2013-05-18 DIAGNOSIS — Z8669 Personal history of other diseases of the nervous system and sense organs: Secondary | ICD-10-CM | POA: Insufficient documentation

## 2013-05-18 DIAGNOSIS — Y9389 Activity, other specified: Secondary | ICD-10-CM | POA: Insufficient documentation

## 2013-05-18 DIAGNOSIS — R4020 Unspecified coma: Secondary | ICD-10-CM

## 2013-05-18 DIAGNOSIS — IMO0002 Reserved for concepts with insufficient information to code with codable children: Secondary | ICD-10-CM | POA: Insufficient documentation

## 2013-05-18 DIAGNOSIS — Z87442 Personal history of urinary calculi: Secondary | ICD-10-CM | POA: Insufficient documentation

## 2013-05-18 DIAGNOSIS — W19XXXA Unspecified fall, initial encounter: Secondary | ICD-10-CM

## 2013-05-18 DIAGNOSIS — I10 Essential (primary) hypertension: Secondary | ICD-10-CM | POA: Insufficient documentation

## 2013-05-18 MED ORDER — IBUPROFEN 800 MG PO TABS: 800.0000 mg | ORAL_TABLET | Freq: Three times a day (TID) | ORAL | Status: DC

## 2013-05-18 MED ORDER — HYDROMORPHONE HCL PF 1 MG/ML IJ SOLN
1.0000 mg | INTRAMUSCULAR | Status: DC | PRN
  Administered 2013-05-18: 1 mg via INTRAVENOUS
  Filled 2013-05-18: qty 1

## 2013-05-18 MED ORDER — SODIUM CHLORIDE 0.9 % IV BOLUS (SEPSIS)
1000.0000 mL | Freq: Once | INTRAVENOUS | Status: AC
  Administered 2013-05-18: 1000 mL via INTRAVENOUS

## 2013-05-18 MED ORDER — ONDANSETRON HCL 4 MG/2ML IJ SOLN
4.0000 mg | Freq: Once | INTRAMUSCULAR | Status: AC
  Administered 2013-05-18: 4 mg via INTRAVENOUS
  Filled 2013-05-18: qty 2

## 2013-05-18 MED ORDER — IOHEXOL 300 MG/ML  SOLN
100.0000 mL | Freq: Once | INTRAMUSCULAR | Status: AC | PRN
  Administered 2013-05-18: 100 mL via INTRAVENOUS

## 2013-05-18 NOTE — ED Notes (Addendum)
Per Dr. Gordy Levan, resident, EMS reported to him that pt fell off of a ladder 10 ft and reports LOC. Pt reports back pain, nad noted.

## 2013-05-18 NOTE — ED Notes (Addendum)
C-spine cleared by dr. Gordy Levan, Resident

## 2013-05-19 NOTE — ED Provider Notes (Signed)
I saw and evaluated the patient, reviewed the resident's note and I agree with the findings and plan.  EKG Interpretation   None       Normal exam except diffuse back pain. Sx improved with pain control, no evidence of acute pathology. Stable for discharge.   Audree Camel, MD 05/19/13 [phone]

## 2013-05-19 NOTE — ED Provider Notes (Signed)
CSN: [id number]     Arrival date & time 05/18/13  1812 History   First MD Initiated Contact with Patient 05/18/13 1814     Chief Complaint  Patient presents with  . Fall   (Consider location/radiation/quality/duration/timing/severity/associated sxs/prior Treatment) HPI Comments: Fall from 10 ft mobile home while working on home, +LOC for approx 1-2 minutes, witnessed  Patient is a 32 y.o. male presenting with fall.  Fall This is a new problem. The current episode started today. The problem has been unchanged. Associated symptoms include abdominal pain, chest pain and neck pain. Pertinent negatives include no chills, coughing, fever, headaches, nausea, rash, sore throat or vomiting. Associated symptoms comments: Back pain. Nothing aggravates the symptoms. He has tried nothing for the symptoms.    Past Medical History  Diagnosis Date  . Diverticulosis        . Hypertension   . Seizures     Event recorder May, 2013, no significant abnormalities  . Asthma   . Migraines   . Depression   . Mood swings   . Chest pain with normal coronary angiography     a.  09/2011 Cath:  Normal Cors, NL LV fxn  . Sleep apnea     a. On CPAP  . Kidney stones   . GERD (gastroesophageal reflux disease)   . Ejection fraction     Normal LV function, catheterization, May, 2013  . Palpitations     event recorder, June, 2013, normal sinus rhythm,  . Mesial temporal sclerosis     Diagnosis per neurology, September, 2013   Past Surgical History  Procedure Laterality Date  . Hernia repair    . Cholecystectomy    . Tonsillectomy    . Neck surgery    . Wrist fusion  left  . Cardiac catheterization     Family History  Problem Relation Age of Onset  . Coronary artery disease Brother     23 MI and 47 years old  . Coronary artery disease Mother   . Coronary artery disease Father   . Stroke Mother   . Diabetes Mother     type 2  . Diabetes Father     type 2   History  Substance Use Topics  .  Smoking status: Never Smoker   . Smokeless tobacco: Never Used  . Alcohol Use: No    Review of Systems  Constitutional: Negative for fever and chills.  HENT: Negative for sore throat.   Eyes: Negative for pain.  Respiratory: Negative for cough and shortness of breath.   Cardiovascular: Positive for chest pain.  Gastrointestinal: Positive for abdominal pain. Negative for nausea and vomiting.  Genitourinary: Negative for dysuria and flank pain.  Musculoskeletal: Positive for back pain and neck pain.  Skin: Negative for rash.  Neurological: Negative for seizures and headaches.    Allergies  Bee venom; Meloxicam; Penicillins; and Sulfa antibiotics  Home Medications   Current Outpatient Rx  Name  Route  Sig  Dispense  Refill  . albuterol (PROVENTIL HFA;VENTOLIN HFA) 108 (90 BASE) MCG/ACT inhaler   Inhalation   Inhale 2 puffs into the lungs every 6 (six) hours as needed. For shortness of breath         . aspirin EC 81 MG tablet   Oral   Take 81 mg by mouth daily.         Marland Kitchen esomeprazole (NEXIUM) 40 MG capsule   Oral   Take 40 mg by mouth daily at 12 noon.         Marland Kitchen  lisinopril-hydrochlorothiazide (PRINZIDE,ZESTORETIC) 20-12.5 MG per tablet   Oral   Take 1 tablet by mouth daily.          Marland Kitchen loratadine (CLARITIN) 10 MG tablet   Oral   Take 10 mg by mouth daily.           Marland Kitchen ibuprofen (ADVIL,MOTRIN) 800 MG tablet   Oral   Take 1 tablet (800 mg total) by mouth 3 (three) times daily.   21 tablet   0    BP 128/74  Pulse 82  Temp(Src) 98.1 F (36.7 C) (Oral)  Resp 18  SpO2 93% Physical Exam  Constitutional: He is oriented to person, place, and time. He appears well-developed and well-nourished. No distress.  HENT:  Head: Normocephalic and atraumatic.  Eyes: Pupils are equal, round, and reactive to light.  Neck: Normal range of motion.  Cardiovascular: Normal rate and regular rhythm.   Pulmonary/Chest: Effort normal and breath sounds normal. No respiratory  distress. He exhibits no bony tenderness.  Abdominal: Soft. He exhibits no distension. There is tenderness in the epigastric area.  Musculoskeletal: Normal range of motion.       Cervical back: He exhibits tenderness. He exhibits no bony tenderness and no deformity.       Thoracic back: He exhibits tenderness. He exhibits no bony tenderness and no deformity.       Lumbar back: He exhibits tenderness. He exhibits no bony tenderness and no deformity.  Neurological: He is alert and oriented to person, place, and time.  Skin: Skin is warm. He is not diaphoretic.    ED Course  Procedures (including critical care time) Labs Review Labs Reviewed - No data to display Imaging Review Dg Thoracic Spine 2 View  05/18/2013   CLINICAL DATA:  Fall from 10 foot height off ladder. Thoracic back pain.  EXAM: THORACIC SPINE - 2 VIEW  COMPARISON:  None.  FINDINGS: There is no evidence of thoracic spine fracture. Alignment is normal. No other significant bone abnormalities are identified.  IMPRESSION: Negative.   Electronically Signed   By: Myles Rosenthal M.D.   On: 05/18/2013 19:59   Dg Lumbar Spine 2-3 Views  05/18/2013   CLINICAL DATA:  Fall from 10 foot height fall off ladder. Low back pain.  EXAM: LUMBAR SPINE - 2-3 VIEW  COMPARISON:  None.  FINDINGS: There is no evidence of lumbar spine fracture. Alignment is normal. Intervertebral disc spaces are maintained.  IMPRESSION: Negative.   Electronically Signed   By: Myles Rosenthal M.D.   On: 05/18/2013 20:00   Ct Head Wo Contrast  05/18/2013   CLINICAL DATA:  Loss of consciousness secondary to a fall from a ladder. Back pain.  EXAM: CT HEAD WITHOUT CONTRAST  TECHNIQUE: Contiguous axial images were obtained from the base of the skull through the vertex without intravenous contrast.  COMPARISON:  CT scan dated 06/30/2012  FINDINGS: No mass lesion. No midline shift. No acute hemorrhage or hematoma. No extra-axial fluid collections. No evidence of acute infarction. No  acute osseous abnormality. Chronic mucosal thickening within the paranasal sinuses. Brain parenchyma appears normal.  IMPRESSION: Normal exam.   Electronically Signed   By: Geanie Cooley M.D.   On: 05/18/2013 20:54   Ct Cervical Spine Wo Contrast  05/18/2013   CLINICAL DATA:  Neck trauma secondary to a fall 10 feet from a ladder. Loss of consciousness.  EXAM: CT CERVICAL SPINE WITHOUT CONTRAST  TECHNIQUE: Multidetector CT imaging of the cervical spine was performed without intravenous contrast.  Multiplanar CT image reconstructions were also generated.  COMPARISON:  CT scan dated 06/30/2012  FINDINGS: There is no acute fracture or subluxation. No prevertebral soft tissue swelling. There is a screw extending through the left lateral mass of C2 into the left lateral mass of C1. There is lucency around the screw indicating loosening.  No facet arthritis.  IMPRESSION: No acute abnormality. Bone resorption around the screw in the lateral masses of C1 and C2 on the left.   Electronically Signed   By: Geanie Cooley M.D.   On: 05/18/2013 20:58   Ct Abdomen Pelvis W Contrast  05/18/2013   CLINICAL DATA:  Trauma secondary to a fall 10 feet from a ladder. Loss of consciousness. Back pain.  EXAM: CT ABDOMEN AND PELVIS WITH CONTRAST  TECHNIQUE: Multidetector CT imaging of the abdomen and pelvis was performed using the standard protocol following bolus administration of intravenous contrast.  CONTRAST:  OMNIPAQUE IOHEXOL 300 MG/ML  SOLN  COMPARISON:  CT scan dated 02/22/2013  FINDINGS: The liver, spleen, pancreas, adrenal glands, and kidneys are normal. No free air or free fluid. Gallbladder is been removed. Biliary tree is otherwise normal. Bowel is normal including the terminal ileum and appendix.  No osseous abnormality.  IMPRESSION: Normal exam.   Electronically Signed   By: Geanie Cooley M.D.   On: 05/18/2013 21:02    EKG Interpretation   None       MDM   1. Fall, initial encounter   2. LOC (loss of  consciousness)    32 yo M with no sig PMHx presents with pain after 10 ft fall with associated LOC.   Multiple imaging modalities performed, no acute intraabdominal, intracranial pathology. Patient with improvement in pain s/p pain medications. No indication for admission at this time. Strict return precautions discussed. Discharged home in stable condition, with instructions to follow-up with PCP as needed.     Imagene Sheller, MD 05/19/13 [phone]

## 2013-05-22 ENCOUNTER — Ambulatory Visit (INDEPENDENT_AMBULATORY_CARE_PROVIDER_SITE_OTHER): Payer: Medicaid Other | Admitting: Cardiology

## 2013-05-22 ENCOUNTER — Encounter: Payer: Self-pay | Admitting: Cardiology

## 2013-05-22 VITALS — BP 146/89 | HR 80 | Ht 69.0 in | Wt 242.0 lb

## 2013-05-22 DIAGNOSIS — W19XXXD Unspecified fall, subsequent encounter: Secondary | ICD-10-CM

## 2013-05-22 DIAGNOSIS — R079 Chest pain, unspecified: Secondary | ICD-10-CM

## 2013-05-22 DIAGNOSIS — Z87898 Personal history of other specified conditions: Secondary | ICD-10-CM

## 2013-05-22 DIAGNOSIS — W19XXXA Unspecified fall, initial encounter: Secondary | ICD-10-CM | POA: Insufficient documentation

## 2013-05-22 DIAGNOSIS — Z9189 Other specified personal risk factors, not elsewhere classified: Secondary | ICD-10-CM

## 2013-05-22 DIAGNOSIS — Z136 Encounter for screening for cardiovascular disorders: Secondary | ICD-10-CM

## 2013-05-22 DIAGNOSIS — R569 Unspecified convulsions: Secondary | ICD-10-CM

## 2013-05-22 DIAGNOSIS — I10 Essential (primary) hypertension: Secondary | ICD-10-CM

## 2013-05-22 NOTE — Assessment & Plan Note (Signed)
He has not had any recurrent significant chest pain.

## 2013-05-22 NOTE — Progress Notes (Signed)
HPI  Patient is seen for cardiology followup. He had chest pain in 2013 and was hospitalized. Catheterization revealed normal coronaries. He has mild hypertension. There is a history of seizures in the past. More recently he was working and stepped onto the top of a ladder. He hit the ground and was taken for full assessment at [facility]. He had no major injuries. He says he cannot remember the fall episode.  Allergies  Allergen Reactions  . Bee Venom Anaphylaxis  . Meloxicam Hives  . Penicillins Hives  . Sulfa Antibiotics Hives    Current Outpatient Prescriptions  Medication Sig Dispense Refill  . albuterol (PROVENTIL HFA;VENTOLIN HFA) 108 (90 BASE) MCG/ACT inhaler Inhale 2 puffs into the lungs every 6 (six) hours as needed. For shortness of breath      . aspirin EC 81 MG tablet Take 81 mg by mouth daily.      Marland Kitchen EPINEPHrine (EPI-PEN) 0.3 mg/0.3 mL SOAJ injection Inject 0.3 mg into the muscle as needed.      Marland Kitchen esomeprazole (NEXIUM) 40 MG capsule Take 40 mg by mouth daily at 12 noon.      Marland Kitchen ibuprofen (ADVIL,MOTRIN) 800 MG tablet Take 800 mg by mouth 3 (three) times daily as needed.      Marland Kitchen lisinopril-hydrochlorothiazide (PRINZIDE,ZESTORETIC) 20-12.5 MG per tablet Take 1 tablet by mouth daily.       Marland Kitchen loratadine (CLARITIN) 10 MG tablet Take 10 mg by mouth daily.        . vitamin C (ASCORBIC ACID) 500 MG tablet Take 500 mg by mouth as needed.      . zolmitriptan (ZOMIG) 5 MG tablet Take 5 mg by mouth as needed for migraine.       No current facility-administered medications for this visit.    History   Social History  . Marital Status: Single    Spouse Name: N/A    Number of Children: N/A  . Years of Education: N/A   Occupational History  . Not on file.   Social History Main Topics  . Smoking status: Never Smoker   . Smokeless tobacco: Never Used  . Alcohol Use: No  . Drug Use: No  . Sexual Activity: Not on file   Other Topics Concern  . Not on file   Social  History Narrative  . No narrative on file    Family History  Problem Relation Age of Onset  . Coronary artery disease Brother     45 MI and 68 years old  . Coronary artery disease Mother   . Coronary artery disease Father   . Stroke Mother   . Diabetes Mother     type 2  . Diabetes Father     type 2    Past Medical History  Diagnosis Date  . Diverticulosis        . Hypertension   . Seizures     Event recorder May, 2013, no significant abnormalities  . Asthma   . Migraines   . Depression   . Mood swings   . Chest pain with normal coronary angiography     a.  09/2011 Cath:  Normal Cors, NL LV fxn  . Sleep apnea     a. On CPAP  . Kidney stones   . GERD (gastroesophageal reflux disease)   . Ejection fraction     Normal LV function, catheterization, May, 2013  . Palpitations     event recorder, June, 2013, normal sinus rhythm,  .  Mesial temporal sclerosis     Diagnosis per neurology, September, 2013    Past Surgical History  Procedure Laterality Date  . Hernia repair    . Cholecystectomy    . Tonsillectomy    . Neck surgery    . Wrist fusion  left  . Cardiac catheterization      Patient Active Problem List   Diagnosis Date Noted  . Fall 05/22/2013  . Diverticulosis   . Hypertension   . Depression   . Mood swings   . Chest pain with normal coronary angiography   . Sleep apnea   . Kidney stones   . GERD (gastroesophageal reflux disease)   . Ejection fraction   . Palpitations   . Seizures   . Mesial temporal sclerosis     ROS   Patient denies fever, chills, headache, sweats, rash, change in vision, change in hearing, chest pain, cough, nausea vomiting, urinary symptoms. All other systems are reviewed and are negative.  PHYSICAL EXAM  Patient is oriented to person time and place. Affect is normal. There is no jugulovenous distention. Lungs are clear. Respiratory effort is nonlabored. Cardiac exam reveals S1 and S2. There no clicks or significant murmurs.  The abdomen is soft. There is no peripheral edema.  Filed Vitals:   05/22/13 1049  BP: 146/89  Pulse: 80  Height: 5\' 9"  (1.753 m)  Weight: 242 lb (109.77 kg)   EKG is done today and reviewed by me. There is normal sinus rhythm. The EKG is normal.  ASSESSMENT & PLAN

## 2013-05-22 NOTE — Assessment & Plan Note (Signed)
Systolic pressure is slightly elevated today. I have chosen not to change his medicines, especially with his recent fall.

## 2013-05-22 NOTE — Assessment & Plan Note (Signed)
There is no proof that he had a seizure. Last documented seizure was September, 2013.

## 2013-05-22 NOTE — Patient Instructions (Signed)
Your physician recommends that you schedule a follow-up appointment in: 6-8 weeks. Your physician recommends that you continue on your current medications as directed. Please refer to the Current Medication list given to you today. Your physician has requested that you have an echocardiogram. Echocardiography is a painless test that uses sound waves to create images of your heart. It provides your doctor with information about the size and shape of your heart and how well your heart's chambers and valves are working. This procedure takes approximately one hour. There are no restrictions for this procedure.

## 2013-05-22 NOTE — Assessment & Plan Note (Signed)
The patient fell when stepping up to the top of a ladder earlier this month. Fortunately he had no major injuries. There is no evidence of seizure. There is no definite evidence of syncope. The patient wore an event recorder in June, 2013. His rhythm was stable at that time. The plan for now will be to proceed with 2-D echo to reassess his left ventricular function. I've chosen not to repeat a monitor at this time. I'll see him back for followup. If he has further symptoms I will consider an implantable loop recorder.

## 2013-06-08 ENCOUNTER — Other Ambulatory Visit: Payer: Self-pay

## 2013-06-08 ENCOUNTER — Other Ambulatory Visit (INDEPENDENT_AMBULATORY_CARE_PROVIDER_SITE_OTHER): Payer: Medicaid Other

## 2013-06-08 ENCOUNTER — Telehealth: Payer: Self-pay | Admitting: *Deleted

## 2013-06-08 ENCOUNTER — Encounter: Payer: Self-pay | Admitting: Cardiology

## 2013-06-08 DIAGNOSIS — Z136 Encounter for screening for cardiovascular disorders: Secondary | ICD-10-CM

## 2013-06-08 DIAGNOSIS — I10 Essential (primary) hypertension: Secondary | ICD-10-CM

## 2013-06-08 DIAGNOSIS — Z87898 Personal history of other specified conditions: Secondary | ICD-10-CM

## 2013-06-08 DIAGNOSIS — R079 Chest pain, unspecified: Secondary | ICD-10-CM

## 2013-06-08 NOTE — Telephone Encounter (Signed)
Patient informed. 

## 2013-06-08 NOTE — Telephone Encounter (Signed)
Message copied by Eustace MooreANDERSON, LYDIA M on Thu Jun 08, 2013  4:22 PM ------      Message from: Myrtis SerKATZ, [place]FFREY D      Created: Thu Jun 08, 2013  3:06 PM       Please let the patient know that his echo looks good. Wall motion of his heart is normal ------

## 2013-07-05 ENCOUNTER — Encounter: Payer: Self-pay | Admitting: Cardiology

## 2013-07-05 ENCOUNTER — Ambulatory Visit (INDEPENDENT_AMBULATORY_CARE_PROVIDER_SITE_OTHER): Payer: Medicaid Other | Admitting: Cardiology

## 2013-07-05 VITALS — BP 127/81 | HR 65 | Ht 69.0 in | Wt 236.1 lb

## 2013-07-05 DIAGNOSIS — W19XXXA Unspecified fall, initial encounter: Secondary | ICD-10-CM

## 2013-07-05 DIAGNOSIS — R002 Palpitations: Secondary | ICD-10-CM
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
## 2013-07-05 NOTE — Assessment & Plan Note (Signed)
He is not having any significant recurrent palpitations. No further workup.

## 2013-07-05 NOTE — Assessment & Plan Note (Signed)
He fell from a ladder in December, 2014. LV function is good. There is no evidence that he is having a significant ongoing cardiac problem. No further workup.

## 2013-07-05 NOTE — Patient Instructions (Signed)
Your physician recommends that you schedule a follow-up appointment in: 2 years. You will receive a reminder letter in the mail in about 20 months reminding you to call and schedule your appointment. If you don't receive this letter, please contact our office. Your physician recommends that you continue on your current medications as directed. Please refer to the Current Medication list given to you today. 

## 2014-05-10 ENCOUNTER — Encounter (HOSPITAL_COMMUNITY): Payer: Self-pay | Admitting: Cardiovascular Disease

## 2020-08-10 ENCOUNTER — Emergency Department (HOSPITAL_COMMUNITY)
Admission: EM | Admit: 2020-08-10 | Discharge: 2020-08-11 | Disposition: A | Discharge status: Home / Self Care | Payer: Medicaid Other | Attending: Emergency Medicine | Admitting: Emergency Medicine

## 2020-08-10 DIAGNOSIS — Z79899 Other long term (current) drug therapy: Secondary | ICD-10-CM | POA: Insufficient documentation

## 2020-08-10 DIAGNOSIS — J45909 Unspecified asthma, uncomplicated: Secondary | ICD-10-CM | POA: Insufficient documentation

## 2020-08-10 DIAGNOSIS — I10 Essential (primary) hypertension: Secondary | ICD-10-CM | POA: Diagnosis not present

## 2020-08-10 DIAGNOSIS — Z7982 Long term (current) use of aspirin: Secondary | ICD-10-CM | POA: Diagnosis not present

## 2020-08-10 DIAGNOSIS — R55 Syncope and collapse: Secondary | ICD-10-CM | POA: Diagnosis not present

## 2020-08-10 DIAGNOSIS — R079 Chest pain, unspecified: Secondary | ICD-10-CM

## 2020-08-10 DIAGNOSIS — R202 Paresthesia of skin: Secondary | ICD-10-CM | POA: Insufficient documentation

## 2020-08-10 NOTE — ED Triage Notes (Addendum)
Assume care from EMS, EMS reports 2 syncope episodes today with LOC. Pt had 1 syncope at home unwitnessed and face plant to the floor EMS states pt had an syncope episode upon their arrival while standing pt up to the stretcher. EMS reports pt was also having chest pain x 4 days and went to be seen but did not receive good treatment from PA  . Upon arrival to room pt is AxO4, No complaints of injuries

## 2020-08-10 NOTE — ED Provider Notes (Signed)
[facility] EMERGENCY DEPARTMENT Provider Note   CSN: [id number] Arrival date & time: 08/10/20  2256     History Chief Complaint  Patient presents with  . Loss of Consciousness  . Chest Pain    Chris Spencer is a 40 y.o. male.  The history is provided by the patient and medical records.  Loss of Consciousness Associated symptoms: chest pain   Chest Pain Associated symptoms: syncope     40 year old male with history of asthma, depression, GERD, hypertension, palpitations, remote history of seizures no longer on medications, presenting to the ED after syncopal episode.  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  States for the past 3 months or so he has been working almost nonstop, having a day off sporadically.  He has been very tired and has been having chest pain for about 4 days now.  Pain described as a pressure like sensation, sometimes with paresthesias into left shoulder.  Denies SOB, diaphoresis, nausea, vomiting.  States he went to a smaller ER recently but did not wait to be seen due to wait time.  States he has been at his brothers house for a few days just trying to relax.  He did have a few beers and mixed drinks tonight with dinner.  States he was laying down, got up to use the bathroom and had a reported syncopal episode.  He does not recall feeling dizzy/lightheaded when this occurred.  Per EMS, sounds like he was out of it for a minute or two but back to baseline shortly after.  Currently, states he just feels very tired and his chest still hurts.    Past Medical History:  Diagnosis Date  . Asthma   . Chest pain with normal coronary angiography    a.  09/2011 Cath:  Normal Cors, NL LV fxn  . Depression   . Diverticulosis       . Ejection fraction    Normal LV function, catheterization, May, 2013  . GERD (gastroesophageal reflux disease)   . Hypertension   . Kidney stones   . Mesial temporal sclerosis     Diagnosis per neurology, September, 2013  . Migraines   . Mood swings   . Palpitations    event recorder, June, 2013, normal sinus rhythm,  . Seizures [facility])    Event recorder May, 2013, no significant abnormalities  . Sleep apnea    a. On CPAP    Patient Active Problem List   Diagnosis Date Noted  . Fall 05/22/2013  . Diverticulosis   . Hypertension   . Depression   . Mood swings   . Chest pain with normal coronary angiography   . Sleep apnea   . Kidney stones   . GERD (gastroesophageal reflux disease)   . Ejection fraction   . Palpitations   . Seizures (HCC)   . Mesial temporal sclerosis     Past Surgical History:  Procedure Laterality Date  . CARDIAC CATHETERIZATION    . CHOLECYSTECTOMY    . HERNIA REPAIR    . LEFT HEART CATHETERIZATION WITH CORONARY ANGIOGRAM N/A 10/02/2011   Procedure: LEFT HEART CATHETERIZATION WITH CORONARY ANGIOGRAM;  Surgeon: Wendall Stade, MD;  Location: [facility] CATH LAB;  Service: Cardiovascular;  Laterality: N/A;  . NECK SURGERY    . TONSILLECTOMY    . WRIST FUSION  left       Family History  Problem Relation Age of Onset  .  Coronary artery disease Brother        233 MI and 39 years old  . Coronary artery disease Mother   . Coronary artery disease Father   . Stroke Mother   . Diabetes Mother        type 2  . Diabetes Father        type 2    Social History   Tobacco Use  . Smoking status: Never Smoker  . Smokeless tobacco: Never Used  Substance Use Topics  . Alcohol use: No  . Drug use: No    Home Medications Prior to Admission medications   Medication Sig Start Date End Date Taking? Authorizing Provider  albuterol (PROVENTIL HFA;VENTOLIN HFA) 108 (90 BASE) MCG/ACT inhaler Inhale 2 puffs into the lungs every 6 (six) hours as needed. For shortness of breath    [provider]  aspirin EC 81 MG tablet Take 81 mg by mouth daily.    [provider]  EPINEPHrine (EPI-PEN) 0.3 mg/0.3 mL SOAJ injection Inject 0.3  mg into the muscle as needed.    [provider]  esomeprazole (NEXIUM) 40 MG capsule Take 40 mg by mouth daily at 12 noon.    [provider]  ibuprofen (ADVIL,MOTRIN) 800 MG tablet Take 800 mg by mouth 3 (three) times daily as needed. 05/18/13   Imagene ShellerWalton, Steve, MD  lisinopril-hydrochlorothiazide (PRINZIDE,ZESTORETIC) 20-12.5 MG per tablet Take 1 tablet by mouth daily.     [provider]  loratadine (CLARITIN) 10 MG tablet Take 10 mg by mouth daily.      [provider]  vitamin C (ASCORBIC ACID) 500 MG tablet Take 500 mg by mouth as needed.    [provider]  zolmitriptan (ZOMIG) 5 MG tablet Take 5 mg by mouth as needed for migraine.    [provider]    Allergies    Bee venom, Meloxicam, Penicillins, Sulfa antibiotics, and Toradol [ketorolac tromethamine]  Review of Systems   Review of Systems  Cardiovascular: Positive for chest pain and syncope.  All other systems reviewed and are negative.   Physical Exam Updated Vital Signs BP 137/82   Pulse 99   Temp 98.7 F (37.1 C) (Oral)   Resp (!) 21   Ht 5\' 9"  (1.753 m)   Wt 113.4 kg   SpO2 98%   BMI 36.92 kg/m   Physical Exam Vitals and nursing note reviewed.  Constitutional:      Appearance: He is well-developed.  HENT:     Head: Normocephalic and atraumatic.     Comments: No visible head trauma Eyes:     Conjunctiva/sclera: Conjunctivae normal.     Pupils: Pupils are equal, round, and reactive to light.  Cardiovascular:     Rate and Rhythm: Normal rate and regular rhythm.     Heart sounds: Normal heart sounds.  Pulmonary:     Effort: Pulmonary effort is normal.     Breath sounds: Normal breath sounds. No decreased breath sounds or wheezing.  Abdominal:     General: Bowel sounds are normal.     Palpations: Abdomen is soft.  Musculoskeletal:        General: Normal range of motion.     Cervical back: Normal range of motion.  Skin:    General: Skin is warm and  dry.  Neurological:     Mental Status: He is alert and oriented to person, place, and time.     ED Results / Procedures / Treatments   Labs (  all labs ordered are listed, but only abnormal results are displayed) Labs Reviewed  COMPREHENSIVE METABOLIC PANEL - Abnormal; Notable for the following components:      Result Value   Glucose, Bld 111 (*)    Total Protein >12.0 (*)    ALT 68 (*)    All other components within normal limits  D-DIMER, QUANTITATIVE (NOT AT [facility]) - Abnormal; Notable for the following components:   D-Dimer, Quant 0.94 (*)    All other components within normal limits  CBC WITH DIFFERENTIAL/PLATELET  ETHANOL  TROPONIN I (HIGH SENSITIVITY)    EKG EKG Interpretation  Date/Time:  Saturday August 10 2020 23:00:30 EST Ventricular Rate:  81 PR Interval:    QRS Duration: 95 QT Interval:  340 QTC Calculation: 395 R Axis:   62 Text Interpretation: Sinus rhythm Confirmed by Zadie Rhine ([id number]) on 08/10/2020 11:24:30 PM   Radiology DG Chest 2 View  Result Date: 08/11/2020 CLINICAL DATA:  Syncope, chest pain EXAM: CHEST - 2 VIEW COMPARISON:  10/16/2011 FINDINGS: The heart size and mediastinal contours are within normal limits. Both lungs are clear. The visualized skeletal structures are unremarkable. IMPRESSION: No active cardiopulmonary disease. Electronically Signed   By: Sharlet Salina M.D.   On: 08/11/2020 00:16   CT Head Wo Contrast  Result Date: 08/11/2020 CLINICAL DATA:  Syncope, loss of consciousness EXAM: CT HEAD WITHOUT CONTRAST TECHNIQUE: Contiguous axial images were obtained from the base of the skull through the vertex without intravenous contrast. COMPARISON:  05/18/2013 FINDINGS: Brain: No acute infarct or hemorrhage. Lateral ventricles and midline structures are unremarkable. No acute extra-axial fluid collections. No mass effect. Vascular: No hyperdense vessel or unexpected calcification. Skull: Normal. Negative for fracture or focal lesion.  Sinuses/Orbits: No acute finding. Other: None. IMPRESSION: 1. No acute intracranial process. Electronically Signed   By: Sharlet Salina M.D.   On: 08/11/2020 03:49   CT Angio Chest PE W and/or Wo Contrast  Result Date: 08/11/2020 CLINICAL DATA:  Syncope, elevated D-dimer, chest pain EXAM: CT ANGIOGRAPHY CHEST WITH CONTRAST TECHNIQUE: Multidetector CT imaging of the chest was performed using the standard protocol during bolus administration of intravenous contrast. Multiplanar CT image reconstructions and MIPs were obtained to evaluate the vascular anatomy. CONTRAST:  OMNIPAQUE IOHEXOL 350 MG/ML SOLN COMPARISON:  08/11/2020 FINDINGS: Cardiovascular: This is a technically adequate evaluation of the pulmonary vasculature. No filling defects or pulmonary emboli. The heart is unremarkable without pericardial effusion. Normal caliber of the thoracic aorta without dissection. Mediastinum/Nodes: No pathologic mediastinal or hilar adenopathy. Thyroid and trachea are grossly normal. There is circumferential wall thickening of the distal thoracic esophagus. Nonemergent follow-up evaluation with esophagram or endoscopy may be useful. Lungs/Pleura: No acute airspace disease, effusion, or pneumothorax. The central airways are patent. Upper Abdomen: No acute abnormality. Musculoskeletal: No acute or destructive bony lesions. Reconstructed images demonstrate no additional findings. Review of the MIP images confirms the above findings. IMPRESSION: 1. No evidence of pulmonary embolus. 2. Circumferential wall thickening of the distal thoracic esophagus, which could reflect distal esophagitis. Nonemergent outpatient evaluation with esophagram or upper endoscopy may be useful to exclude underlying neoplasm, if not previously performed. Electronically Signed   By: Sharlet Salina M.D.   On: 08/11/2020 03:45    Procedures Procedures   Medications Ordered in ED Medications  acetaminophen (TYLENOL) tablet 1,000 mg (1,000 mg  Oral Given 08/11/20 0304)  alum & mag hydroxide-simeth (MAALOX/MYLANTA) 200-200-20 MG/5ML suspension 30 mL (30 mLs Oral Given 08/11/20 0340)    And  lidocaine (XYLOCAINE) 2 % viscous mouth solution 15 mL (15 mLs Oral Given 08/11/20 0340)  iohexol (OMNIPAQUE) 350 MG/ML injection 100 mL (100 mLs Intravenous Contrast Given 08/11/20 0326)    ED Course  I have reviewed the triage vital signs and the nursing notes.  Pertinent labs & imaging results that were available during my care of the patient were reviewed by me and considered in my medical decision making (see chart for details).    MDM Rules/Calculators/A&P  40 year old male here after syncopal event.  Has been having chest pain for about 4 days now, recently went to local ER but did not stay for evaluation due to long wait.  He does admit to increased stress-- works as long Secondary school teacher, has had very few days off and working very long hours.  Was at brother's house tonight, had a few drinks and went to lay down.  Got up to use the bathroom and had a apparent syncopal episode middle of the floor.  There was no seizure activity.  On arrival to ED he is awake, alert, fully oriented.  He does not have any significant signs of head trauma.  He has no focal deficits.  States he just feels "exhausted".  EKG is nonischemic.  Will obtain labs including troponin and D-dimer given his reported chest pain.  Will get CT head.  On chart review, does have history of syncope in the past related to exhaustion.  CT head negative.  CXR clear.  Initial labs as above-- d-dimer is elevated.  CTA obtained and is negative for PE but does have findings of likely esophagitis.  This may very well be the source of his chest pain. Does report drinking a lot of coffee when driving through the night. He was given GI cocktail here with some improvement.  Lower suspicion for ACS.  Patient does have noted elevated protein here today-- prior labs have been normal.  This could  be transient in relation to dehydration or similar.  Given findings on CT, have recommended to start pepcid for the next 2 weeks.  Encouraged to try to limit spicy, acidic foods, coffee, and alcohol as this can worsen GI symptoms.  He will need follow-up with PCP for re-check of his serum protein and reflux symptoms.  Discussed he may need GI referral if reflux symptoms not improving, may need further OP work-up if protein still high on re-check.  Return here for any new/acute changes.  Final Clinical Impression(s) / ED Diagnoses Final diagnoses:  Syncope, unspecified syncope type  Chest pain in adult    Rx / DC Orders ED Discharge Orders         Ordered    famotidine (PEPCID) 20 MG tablet  2 times daily        08/11/20 0403           Garlon Hatchet, PA-C 08/11/20 Shonna Chock, MD 08/11/20 [phone]

## 2020-08-11 ENCOUNTER — Emergency Department (HOSPITAL_COMMUNITY): Payer: Medicaid Other

## 2020-08-11 LAB — CBC WITH DIFFERENTIAL/PLATELET
Abs Immature Granulocytes: 0.05 10*3/uL (ref 0.00–0.07)
Basophils Absolute: 0.1 10*3/uL (ref 0.0–0.1)
Basophils Relative: 1 %
Eosinophils Absolute: 0.2 10*3/uL (ref 0.0–0.5)
Eosinophils Relative: 2 %
HCT: 44 % (ref 39.0–52.0)
Hemoglobin: 15.1 g/dL (ref 13.0–17.0)
Immature Granulocytes: 1 %
Lymphocytes Relative: 29 %
Lymphs Abs: 2.5 10*3/uL (ref 0.7–4.0)
MCH: 29.7 pg (ref 26.0–34.0)
MCHC: 34.3 g/dL (ref 30.0–36.0)
MCV: 86.4 fL (ref 80.0–100.0)
Monocytes Absolute: 0.8 10*3/uL (ref 0.1–1.0)
Monocytes Relative: 9 %
Neutro Abs: 5.1 10*3/uL (ref 1.7–7.7)
Neutrophils Relative %: 58 %
Platelets: 223 10*3/uL (ref 150–400)
RBC: 5.09 MIL/uL (ref 4.22–5.81)
RDW: 12.4 % (ref 11.5–15.5)
WBC: 8.6 10*3/uL (ref 4.0–10.5)
nRBC: 0 % (ref 0.0–0.2)

## 2020-08-11 LAB — COMPREHENSIVE METABOLIC PANEL
ALT: 68 U/L — ABNORMAL HIGH (ref 0–44)
AST: 36 U/L (ref 15–41)
Albumin: 4.1 g/dL (ref 3.5–5.0)
Alkaline Phosphatase: 73 U/L (ref 38–126)
Anion gap: 9 (ref 5–15)
BUN: 13 mg/dL (ref 6–20)
CO2: 24 mmol/L (ref 22–32)
Calcium: 9.8 mg/dL (ref 8.9–10.3)
Chloride: 104 mmol/L (ref 98–111)
Creatinine, Ser: 1.02 mg/dL (ref 0.61–1.24)
GFR, Estimated: >60 mL/min (ref >60)
Glucose, Bld: 111 mg/dL — ABNORMAL HIGH (ref 70–99)
Potassium: 3.7 mmol/L (ref 3.5–5.1)
Sodium: 137 mmol/L (ref 135–145)
Total Bilirubin: 0.5 mg/dL (ref 0.3–1.2)
Total Protein: >12 g/dL — ABNORMAL HIGH (ref 6.5–8.1)

## 2020-08-11 LAB — D-DIMER, QUANTITATIVE: D-Dimer, Quant: 0.94 ug/mL-FEU — ABNORMAL HIGH (ref 0.00–0.50)

## 2020-08-11 LAB — TROPONIN I (HIGH SENSITIVITY): Troponin I (High Sensitivity): 10 ng/L (ref <18)

## 2020-08-11 LAB — ETHANOL: Alcohol, Ethyl (B): <10 mg/dL (ref <10)

## 2020-08-11 MED ORDER — IOHEXOL 350 MG/ML SOLN
100.0000 mL | Freq: Once | INTRAVENOUS | Status: AC | PRN
  Administered 2020-08-11: 100 mL via INTRAVENOUS

## 2020-08-11 MED ORDER — ALUM & MAG HYDROXIDE-SIMETH 200-200-20 MG/5ML PO SUSP
30.0000 mL | Freq: Once | ORAL | Status: AC
  Administered 2020-08-11: 30 mL via ORAL
  Filled 2020-08-11: qty 30

## 2020-08-11 MED ORDER — FAMOTIDINE 20 MG PO TABS: 20.0000 mg | ORAL_TABLET | Freq: Two times a day (BID) | ORAL | 0 refills | Status: AC

## 2020-08-11 MED ORDER — LIDOCAINE VISCOUS HCL 2 % MT SOLN
15.0000 mL | Freq: Once | OROMUCOSAL | Status: AC
  Administered 2020-08-11: 15 mL via ORAL
  Filled 2020-08-11: qty 15

## 2020-08-11 MED ORDER — ACETAMINOPHEN 500 MG PO TABS
1000.0000 mg | ORAL_TABLET | Freq: Once | ORAL | Status: AC
  Administered 2020-08-11: 1000 mg via ORAL

## 2020-08-11 NOTE — Discharge Instructions (Addendum)
Your cardiac work-up today was normal.  Your protein level was high today, this will need to be re-checked with your doctor. Your CT showed likely some esophagitis which may be causing your pain. I would try to watch your intake of spicy/acidic foods for the next few days.  Coffee and alcohol can aggravate it a little further so be mindful of that too. Follow-up with your primary care doctor.  May need to see GI if not improving on medications. Return here for new concerns.
